# Patient Record
Sex: Female | Born: 1945 | Race: White | Hispanic: No | Marital: Married | Smoking: Never smoker
Health system: Southern US, Community
[De-identification: ages and names within clinical notes are randomized; demographics above are authoritative.]

## PROBLEM LIST (undated history)

## (undated) DIAGNOSIS — K589 Irritable bowel syndrome without diarrhea: Secondary | ICD-10-CM

## (undated) DIAGNOSIS — I10 Essential (primary) hypertension: Secondary | ICD-10-CM

## (undated) DIAGNOSIS — F419 Anxiety disorder, unspecified: Secondary | ICD-10-CM

## (undated) DIAGNOSIS — E785 Hyperlipidemia, unspecified: Secondary | ICD-10-CM

## (undated) DIAGNOSIS — G510 Bell's palsy: Secondary | ICD-10-CM

## (undated) DIAGNOSIS — D496 Neoplasm of unspecified behavior of brain: Secondary | ICD-10-CM

## (undated) DIAGNOSIS — K219 Gastro-esophageal reflux disease without esophagitis: Secondary | ICD-10-CM

## (undated) DIAGNOSIS — L409 Psoriasis, unspecified: Secondary | ICD-10-CM

## (undated) DIAGNOSIS — G459 Transient cerebral ischemic attack, unspecified: Secondary | ICD-10-CM

## (undated) DIAGNOSIS — M069 Rheumatoid arthritis, unspecified: Secondary | ICD-10-CM

## (undated) HISTORY — PX: COLONOSCOPY: SHX174

## (undated) HISTORY — PX: TONSILLECTOMY: SUR1361

## (undated) HISTORY — DX: Neoplasm of unspecified behavior of brain: D49.6

## (undated) HISTORY — PX: ESOPHAGOGASTRODUODENOSCOPY: SHX1529

## (undated) HISTORY — PX: ABDOMINAL HYSTERECTOMY: SHX81

---
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
## 1981-02-19 HISTORY — PX: BUNIONECTOMY: SHX129

## 1985-02-19 HISTORY — PX: LIPOMA EXCISION: SHX5283

## 1995-02-20 HISTORY — PX: CARDIAC CATHETERIZATION: SHX172

## 1997-12-24 ENCOUNTER — Encounter (HOSPITAL_COMMUNITY): Admission: RE | Admit: 1997-12-24 | Discharge: 1998-04-11 | Payer: Self-pay

## 1998-04-15 ENCOUNTER — Encounter (HOSPITAL_COMMUNITY): Admission: RE | Admit: 1998-04-15 | Discharge: 1998-05-10 | Payer: Self-pay

## 1999-01-19 ENCOUNTER — Encounter (HOSPITAL_COMMUNITY): Admission: RE | Admit: 1999-01-19 | Discharge: 1999-04-19 | Payer: Self-pay | Admitting: *Deleted

## 1999-11-23 ENCOUNTER — Ambulatory Visit (HOSPITAL_COMMUNITY): Admission: RE | Admit: 1999-11-23 | Discharge: 1999-11-23 | Payer: Self-pay | Admitting: Gastroenterology

## 1999-11-23 ENCOUNTER — Encounter: Payer: Self-pay | Admitting: Gastroenterology

## 2003-03-05 ENCOUNTER — Ambulatory Visit (HOSPITAL_COMMUNITY): Admission: RE | Admit: 2003-03-05 | Discharge: 2003-03-05 | Payer: Self-pay | Admitting: Neurology

## 2003-05-25 ENCOUNTER — Ambulatory Visit (HOSPITAL_COMMUNITY): Admission: RE | Admit: 2003-05-25 | Discharge: 2003-05-25 | Payer: Self-pay | Admitting: *Deleted

## 2003-11-23 ENCOUNTER — Encounter (HOSPITAL_COMMUNITY): Admission: RE | Admit: 2003-11-23 | Discharge: 2003-12-23 | Payer: Self-pay | Admitting: Internal Medicine

## 2004-02-20 DIAGNOSIS — G459 Transient cerebral ischemic attack, unspecified: Secondary | ICD-10-CM

## 2004-02-20 HISTORY — DX: Transient cerebral ischemic attack, unspecified: G45.9

## 2009-02-19 HISTORY — PX: FRACTURE SURGERY: SHX138

## 2009-03-19 ENCOUNTER — Inpatient Hospital Stay (HOSPITAL_COMMUNITY): Admission: EM | Admit: 2009-03-19 | Discharge: 2009-03-27 | Payer: Self-pay | Admitting: Emergency Medicine

## 2010-05-07 LAB — GLUCOSE, CAPILLARY
Glucose-Capillary: 112 mg/dL — ABNORMAL HIGH (ref 70–99)
Glucose-Capillary: 118 mg/dL — ABNORMAL HIGH (ref 70–99)
Glucose-Capillary: 120 mg/dL — ABNORMAL HIGH (ref 70–99)
Glucose-Capillary: 124 mg/dL — ABNORMAL HIGH (ref 70–99)

## 2010-05-07 LAB — CBC
MCHC: 32.4 g/dL (ref 30.0–36.0)
MCV: 82.1 fL (ref 78.0–100.0)
MCV: 82.6 fL (ref 78.0–100.0)
MCV: 82.7 fL (ref 78.0–100.0)
Platelets: 200 10*3/uL (ref 150–400)
Platelets: 211 10*3/uL (ref 150–400)
RDW: 17.6 % — ABNORMAL HIGH (ref 11.5–15.5)
WBC: 13.4 10*3/uL — ABNORMAL HIGH (ref 4.0–10.5)
WBC: 14 10*3/uL — ABNORMAL HIGH (ref 4.0–10.5)
WBC: 16.2 10*3/uL — ABNORMAL HIGH (ref 4.0–10.5)

## 2010-05-07 LAB — BASIC METABOLIC PANEL
BUN: 6 mg/dL (ref 6–23)
BUN: 9 mg/dL (ref 6–23)
Chloride: 104 mEq/L (ref 96–112)
Creatinine, Ser: 0.94 mg/dL (ref 0.4–1.2)
GFR calc Af Amer: >60 mL/min (ref >60)
GFR calc Af Amer: >60 mL/min (ref >60)
GFR calc non Af Amer: 53 mL/min — ABNORMAL LOW (ref >60)
GFR calc non Af Amer: 60 mL/min — ABNORMAL LOW (ref >60)
Glucose, Bld: 128 mg/dL — ABNORMAL HIGH (ref 70–99)
Glucose, Bld: 139 mg/dL — ABNORMAL HIGH (ref 70–99)
Potassium: 3.5 mEq/L (ref 3.5–5.1)
Potassium: 3.7 mEq/L (ref 3.5–5.1)
Sodium: 139 mEq/L (ref 135–145)
Sodium: 140 mEq/L (ref 135–145)

## 2010-05-07 LAB — COMPREHENSIVE METABOLIC PANEL
ALT: 20 U/L (ref 0–35)
Albumin: 3.1 g/dL — ABNORMAL LOW (ref 3.5–5.2)
Alkaline Phosphatase: 127 U/L — ABNORMAL HIGH (ref 39–117)
BUN: 11 mg/dL (ref 6–23)
CO2: 29 mEq/L (ref 19–32)
Calcium: 8.2 mg/dL — ABNORMAL LOW (ref 8.4–10.5)
Creatinine, Ser: 1.01 mg/dL (ref 0.4–1.2)
GFR calc non Af Amer: 55 mL/min — ABNORMAL LOW (ref >60)
Potassium: 3.4 mEq/L — ABNORMAL LOW (ref 3.5–5.1)
Sodium: 136 mEq/L (ref 135–145)

## 2010-05-07 LAB — DIFFERENTIAL
Basophils Absolute: 0.1 10*3/uL (ref 0.0–0.1)
Eosinophils Relative: 1 % (ref 0–5)
Lymphocytes Relative: 10 % — ABNORMAL LOW (ref 12–46)
Lymphs Abs: 1.6 10*3/uL (ref 0.7–4.0)
Monocytes Absolute: 0.6 10*3/uL (ref 0.1–1.0)
Monocytes Relative: 4 % (ref 3–12)

## 2010-05-07 LAB — PROTIME-INR: Prothrombin Time: 15.1 seconds (ref 11.6–15.2)

## 2010-05-07 LAB — URINALYSIS, ROUTINE W REFLEX MICROSCOPIC
Bilirubin Urine: NEGATIVE
Glucose, UA: NEGATIVE mg/dL
Hgb urine dipstick: NEGATIVE
Ketones, ur: NEGATIVE mg/dL
Nitrite: NEGATIVE
Specific Gravity, Urine: 1.012 (ref 1.005–1.030)
pH: 5.5 (ref 5.0–8.0)

## 2010-05-07 LAB — SAMPLE TO BLOOD BANK

## 2010-05-07 LAB — APTT: aPTT: 32 seconds (ref 24–37)

## 2010-05-10 LAB — GLUCOSE, CAPILLARY
Glucose-Capillary: 104 mg/dL — ABNORMAL HIGH (ref 70–99)
Glucose-Capillary: 106 mg/dL — ABNORMAL HIGH (ref 70–99)
Glucose-Capillary: 109 mg/dL — ABNORMAL HIGH (ref 70–99)
Glucose-Capillary: 111 mg/dL — ABNORMAL HIGH (ref 70–99)
Glucose-Capillary: 111 mg/dL — ABNORMAL HIGH (ref 70–99)
Glucose-Capillary: 112 mg/dL — ABNORMAL HIGH (ref 70–99)
Glucose-Capillary: 113 mg/dL — ABNORMAL HIGH (ref 70–99)
Glucose-Capillary: 118 mg/dL — ABNORMAL HIGH (ref 70–99)
Glucose-Capillary: 122 mg/dL — ABNORMAL HIGH (ref 70–99)
Glucose-Capillary: 124 mg/dL — ABNORMAL HIGH (ref 70–99)
Glucose-Capillary: 135 mg/dL — ABNORMAL HIGH (ref 70–99)
Glucose-Capillary: 97 mg/dL (ref 70–99)

## 2010-05-10 LAB — BASIC METABOLIC PANEL
CO2: 31 mEq/L (ref 19–32)
Calcium: 8.2 mg/dL — ABNORMAL LOW (ref 8.4–10.5)
Creatinine, Ser: 0.97 mg/dL (ref 0.4–1.2)
GFR calc Af Amer: >60 mL/min (ref >60)

## 2010-05-10 LAB — CROSSMATCH: Antibody Screen: NEGATIVE

## 2010-05-10 LAB — CBC
MCHC: 32.5 g/dL (ref 30.0–36.0)
Platelets: 212 10*3/uL (ref 150–400)
RBC: 3.19 MIL/uL — ABNORMAL LOW (ref 3.87–5.11)
WBC: 13.6 10*3/uL — ABNORMAL HIGH (ref 4.0–10.5)

## 2010-07-07 NOTE — Op Note (Signed)
NAMEJAWANA, [name]                             ACCOUNT NO.:  [id number]   MEDICAL RECORD NO.:  [id number]                   PATIENT TYPE:  OIB   LOCATION:  2899                                 FACILITY:  [facility]   PHYSICIAN:  Scott R. Rehm, D.D.S.               DATE OF BIRTH:  Mar 26, 1945   DATE OF PROCEDURE:  05/25/2003  DATE OF DISCHARGE:  05/25/2003                                 OPERATIVE REPORT   PREOPERATIVE DIAGNOSIS:  Dental caries.   PROCEDURE PERFORMED:  Surgical extraction of all teeth numbers 4, 5, 8, 9,  10, 11, 12, 20, 21, 22, 23, 24, 25, 26, 27, and 28; alveoloplasty times four  quadrants.   ANESTHESIA:  General anesthesia with orotracheal intubation.   SURGEON:  Scott R. Egbert Garibaldi, D.D.S.   BRIEF OPERATIVE INDICATIONS:  This is a 65 year old female referred by Dr.  Teodoro Kil for full mouth extraction with alveoloplasty. The patient  currently reports no pain.   PAST MEDICAL HISTORY:  Allergies are PENICILLIN and ASPIRIN. Medications are  Amaryl, Evista, and a fluid pill, she said. Surgical history is hysterectomy  and right foot and leg surgery.  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  She has gross caries with her  remaining teeth and these are the indications for her surgery.   BRIEF DESCRIPTION OF OPERATIVE TECHNIQUE:  [name] is brought to the main OR at  [facility], placed in the supine position. Appropriate monitors were  attached, general anesthesia was induced. She was given IV and inhalational  techniques, and an orotracheal intubation was then done by Dr. Sondra Come without  complications. The tube was secured to the patient's left side. The oral  cavity was suctioned and a throat pack was placed. The patient was then  prepped and draped in standard fashion for intraoral myofascial surgical  procedure. At this point 2% Xylocaine, 1:100,000 epinephrine a  total of 8 cc  was given bilaterally to the inferior volar long buccal, posterior-superior  alveolar, greater palatine, and buccal infiltration of the mandible was also  done in the maxilla. At this point, a #15 blade was used to make an incision  in tooth numbers 4 and 10, and periosteal elevator was used to elevate the  buccal tissue using a  #7 round burr. Minor osseous reduction was then in  the buccal, followed by use of a straight elevator, upbiters and forceps,  tooth numbers 4, 5, 8, 9, and 10 were removed without complications.  Alveoloplasty followed, followed by normal saline irrigation. Then 3-0  chromic gut sutures  times four finger-of-eight were placed. At this point  the patient's low right quadrant was addressed and a #15 blade was used to  make an incision distally to tooth #28; an incision going up to tooth number  23  area, using a #8 round burr, once again moderate osseous reduction was  done to the buccal, followed by a straight elevator. At which time, teeth  numbers 23, 24, 25, 26, 27, 28 were subluxed and removed using Ash forceps  in appropriate fashion. Minor alveoloplasty was then done on both sides of  the buccal lingular area and copious normal saline irrigation. Then 3-0  chromic gut sutures times six in a direct suturing fashion was done in this  region. At this point, the throat pack was removed and the tube was  transferred to the patient's right side. The oral cavity was then suctioned  and removed. Throat pack was placed. At this point a #15 blade was used to  make an incision from tooth number 12 to tooth number 11 area. Once again,  full thickness mucoperiosteal flaps were elevated using a periosteal  elevator and a #8 round burr and copious normal saline irrigation was used  to cut a buccal trial from around teeth 11 and 12.  Using a sterile  elevator, these teeth were subluxed and removed with the upbiter forceps.  Once again, minor alveoloplasty  followed in this quadrant, followed by  irrigation. Then 3-0 chromic gut sutures times four were placed once again  in finger-of-eight fashion.  At this point in the lower left quadrant, a #15  blade was used to make an incision distal to tooth #20 up through #22. Once  again, a full thickness mucoperiosteal flap was elevated using a periosteal  elevator and a #8 round burr. Copious normal saline irrigation was used to  cut a buccal trial from teeth numbers 20, 21, and 22. Once again using a  straight elevator, these teeth were subluxed and removed using the Ash  forceps. Minor alveoloplasty was done in this quadrant, followed by  irrigation, and then 3-0 chromic gut suture times three. At this point, the  oral cavity was suctioned. Needle, sponge, and instrument counts were found  to be correct. Estimated blood loss during the procedure was 15 cc. Fluid  replacement 500 cc. Urine output due to void.  The patient was extubated and  transferred to the recovery room. Vital signs were stable. The patient is  also given clindamycin 600 mg IV piggyback prior to the procedure.                                               Scott R. Egbert Garibaldi, D.D.S.    SRR/MEDQ  D:  05/25/2003  T:  05/25/2003  Job:  [id number]

## 2010-12-13 ENCOUNTER — Other Ambulatory Visit: Payer: Self-pay

## 2010-12-13 ENCOUNTER — Encounter (HOSPITAL_COMMUNITY): Payer: Self-pay

## 2010-12-13 ENCOUNTER — Encounter (HOSPITAL_COMMUNITY)
Admission: RE | Admit: 2010-12-13 | Discharge: 2010-12-13 | Disposition: A | Discharge status: Home / Self Care | Payer: Medicare Other | Source: Ambulatory Visit | Attending: Ophthalmology | Admitting: Ophthalmology

## 2010-12-13 HISTORY — DX: Essential (primary) hypertension: I10

## 2010-12-13 HISTORY — DX: Anxiety disorder, unspecified: F41.9

## 2010-12-13 HISTORY — DX: Bell's palsy: G51.0

## 2010-12-13 HISTORY — DX: Transient cerebral ischemic attack, unspecified: G45.9

## 2010-12-13 HISTORY — DX: Psoriasis, unspecified: L40.9

## 2010-12-13 HISTORY — DX: Irritable bowel syndrome without diarrhea: K58.9

## 2010-12-13 HISTORY — DX: Hyperlipidemia, unspecified: E78.5

## 2010-12-13 HISTORY — DX: Gastro-esophageal reflux disease without esophagitis: K21.9

## 2010-12-13 HISTORY — DX: Rheumatoid arthritis, unspecified: M06.9

## 2010-12-13 LAB — CBC
MCHC: 30.2 g/dL (ref 30.0–36.0)
Platelets: 250 10*3/uL (ref 150–400)
RDW: 16.9 % — ABNORMAL HIGH (ref 11.5–15.5)
WBC: 13.3 10*3/uL — ABNORMAL HIGH (ref 4.0–10.5)

## 2010-12-13 LAB — BASIC METABOLIC PANEL
BUN: 11 mg/dL (ref 6–23)
Chloride: 99 mEq/L (ref 96–112)
GFR calc Af Amer: 63 mL/min — ABNORMAL LOW (ref >90)
GFR calc non Af Amer: 54 mL/min — ABNORMAL LOW (ref >90)
Potassium: 3.5 mEq/L (ref 3.5–5.1)
Sodium: 139 mEq/L (ref 135–145)

## 2010-12-13 NOTE — Patient Instructions (Addendum)
[name]  12/13/2010   Your procedure is scheduled  on:  12/21/2010  Report to [facility] at  1000  AM.  Call this number if you have problems the morning of surgery: [phone]   Remember:   Do not eat food:After Midnight.  Do not drink clear liquids: After Midnight.  Take these medicines the morning of surgery with A SIP OF WATER: nexium,evista   Do not wear jewelry, make-up or nail polish.  Do not wear lotions, powders, or perfumes. You may wear deodorant.  Do not shave 48 hours prior to surgery.  Do not bring valuables to the hospital.  Contacts, dentures or bridgework may not be worn into surgery.  Leave suitcase in the car. After surgery it may be brought to your room.  For patients admitted to the hospital, checkout time is 11:00 AM the day of discharge.   Patients discharged the day of surgery will not be allowed to drive home.  Name and phone number of your driver: husband  Special Instructions: N/A   Please read over the following fact sheets that you were given: Pain Booklet, Surgical Site Infection Prevention, Anesthesia Post-op Instructions and Care and Recovery After Surgery PATIENT INSTRUCTIONS POST-ANESTHESIA  IMMEDIATELY FOLLOWING SURGERY:  Do not drive or operate machinery for the first twenty four hours after surgery.  Do not make any important decisions for twenty four hours after surgery or while taking narcotic pain medications or sedatives.  If you develop intractable nausea and vomiting or a severe headache please notify your doctor immediately.  FOLLOW-UP:  Please make an appointment with your surgeon as instructed. You do not need to follow up with anesthesia unless specifically instructed to do so.  WOUND CARE INSTRUCTIONS (if applicable):  Keep a dry clean dressing on the anesthesia/puncture wound site if there is drainage.  Once the wound has quit draining you may leave it open to air.  Generally you should leave the bandage intact for twenty four hours  unless there is drainage.  If the epidural site drains for more than 36-48 hours please call the anesthesia department.  QUESTIONS?:  Please feel free to call your physician or the hospital operator if you have any questions, and they will be happy to assist you.     [facility] Anesthesia Department [address] [place] [phone]    Cataract Surgery Care After Please read the instructions outlined below and refer to this sheet in the next few weeks. These discharge instructions provide you with general information on caring for yourself after you leave the hospital. Your caregiver may also give you specific instructions.  HOME CARE INSTRUCTIONS   After surgery, your caregiver will schedule exams to check on your progress. For a few days after surgery, you may be prescribed eyedrops or other medications to help healing and control the pressure inside your eye. Ask your caregiver how to use your medications, when to take them, and what effects they can have.   It's normal to feel itching and mild discomfort for a few days after cataract surgery. Some fluid discharge is also common, and your eye may be sensitive to light and touch. If you have discomfort, your caregiver may suggest a pain reliever every 4 to 6 hours. After 1 to 2 days, even moderate discomfort should disappear. In most cases, healing will take about 6 weeks.   Try not to touch or rub your eyes You may be instructed to wear an eye shield  or eye patch after surgery. If not, wear sunglasses to protect your eyes.   Keep soap and shampoo out of your eyes.   Only take over-the-counter or prescription medicines for pain, discomfort, or fever as directed by your caregiver.   If you suffer more than mild pain, or you experience loss of vision or increasing redness of your eye, you should contact your caregiver for advice.   When you are home, try not to bend or lift heavy objects. Bending increases pressure in the  eye. You can walk, climb stairs, and do light household chores. You can quickly return to many everyday activities, but your vision may be blurry. Ask your caregiver when you can resume driving.   If you just received an IOL (intraocular lens), you may notice that colors are very bright or have a blue tinge. Also, if you've been in bright sunlight, everything may appear reddish for a few hours. If you see these color tinges, it is because your lens is clear and no longer cloudy. Within a few months after receiving an IOL, these "extra" colors should go away. When you have healed, you will probably need new glasses.  SEEK MEDICAL CARE IF:   There is increased bruising around your eye.   You have a worsening or sudden loss of vision.   You notice redness, swelling, or increasing pain in the eye.   You have a fever.  Although we do not know how to protect against cataracts, people over the age of 46 are at risk for many vision problems. If you are age 59 or older, you should have an eye examination which includes dilating the pupils at least every 2 years. This kind of exam allows your eye care professional to check for signs of age-related macular degeneration, glaucoma, cataracts, and other vision disorders. Document Released: 08/25/2004 Document Revised: 10/18/2010 Document Reviewed: 02/04/2008 [facility] Patient Information 2012 [place], [place].Cataract A cataract is a clouding of the lens of the eye. It is most often related to aging. A cataract is not a "film" over the surface of the eye. The lens is inside the eye and changes size of the pupil. The lens can enlarge to let more light enter the eye in dark environments and contract the size of the pupil to let in bright light. The lens is the part of the eye that helps focus light on the retina. The retina is the eye's light-sensitive layer. It is in the back of the eye that sends visual signals to the brain. In a normal eye, light passes through the  lens and gets focused on the retina. To help produce a sharp image, the lens must remain clear. When a lens becomes cloudy, vision is compromised by the degree and nature of the clouding. Certain cataracts make people more near-sighted as they develop, others increase glare, and all reduce vision to some degree or another. A cataract that is so dense that it becomes milky white and a white opacity can be seen through the pupil. When the white color is seen, it is called a "mature" or "hyper-mature cataract." Such cataracts cause total blindness in the affected eye. The cataract must be removed to prevent damage to the eye itself. Some types of cataracts can cause a secondary disease of the eye, such as certain types of glaucoma. In the early stages, better lighting and eyeglasses may lessen vision problems caused by cataracts. At a certain point, surgery may be needed to improve vision. CAUSES  Aging. However, cataracts may occur at any age, even in newborns.   Certain drugs.   Trauma to the eye.   Certain diseases (such as diabetes).   Inherited or acquired medical syndromes.  SYMPTOMS   Gradual, progressive drop in vision in the affected eye. Cataracts may develop at different rates in each eye. Cataracts may even be in just one eye with the other unaffected.   Cataracts due to trauma may develop quickly, sometimes over a matter or days or even hours. The result is severe and rapid visual loss.  DIAGNOSIS  To detect a cataract, an eye doctor examines the lens. A well developed cataract can be diagnosed without dilating the pupil. Early cataracts and others of a specific nature are best diagnosed with an exam of the eyes with the pupils dilated by drops. TREATMENT   For an early cataract, vision may improve by using different eyeglasses or stronger lighting.   If the above measures do not help, surgery is the only effective treatment. This treatment removes the cloudy lens and replaces it  with a substitute lens (Intraocular lens, or IOL). Newly developed IOL technology allows the implanted lens to improve vision both at a distance and up close. Discuss with your eye surgeon about the possibility of still needing glasses. Also discuss how visual coordination between both eyes will be affected.  A cataract needs to be removed only when vision loss interferes with your everyday activities such as driving, reading or watching TV. You and your eye doctor can make that decision together. In most cases, waiting until you are ready to have cataract surgery will not harm your eye. If you have cataracts in both eyes, only one should be removed at a time. This allows the operated eye to heal and be out of danger from serious problems (such as infection or poor wound healing) before having the other eye undergo surgery.  Sometimes, a cataract should be removed even if it does not cause problems with your vision. For example, a cataract should be removed if it prevents examination or treatment of another eye problem. Just as you cannot see out of the affected eye well, your doctor cannot see into your eye well through a cataract. The vast majority of people who have cataract surgery have better vision afterward. CATARACT REMOVAL There are two primary ways to remove a cataract. Your doctor can explain the differences and help determine which is best for you:  Phacoemulsification (small incision cataract surgery). This involves making a small cut (incision) on the edge of the clear, dome-shaped surface that covers the front of the eye (the cornea). An injection behind the eye or eye drops are given to make this a painless procedure. The doctor then inserts a tiny probe into the eye. This device emits ultrasound waves that soften and break up the cloudy center of the lens so it can be removed by suction. Most cataract surgery is done this way. The cuts are usually so small and performed in such a manner that  often no sutures are needed to keep it closed.   Extracapsular surgery. Your doctor makes a slightly longer incision on the side of the cornea. The doctor removes the hard center of the lens. The remainder of the lens is then removed by suction. In some cases, extremely fine sutures are needed which the doctor may, or may not remove in the office after the surgery.  When an IOL is implanted, it needs no care. It becomes  a permanent part of your eye and cannot be seen or felt.  Some people cannot have an IOL. They may have problems during surgery, or maybe they have another eye disease. For these people, a soft contact lens may be suggested. If an IOL or contact lens cannot be used, very powerful and thick glasses are required after surgery. Since vision is very different through such thick glasses, it is important to have your doctor discuss the impact on your vision after any cataract surgery where there is no plan to implant an IOL. The normal lens of the eye is covered by a clear capsule. Both phacoemulsification and extracapsular surgery require that the back surface of this lens capsule be left in place. This helps support IOLs and prevents the IOL from dislocating and falling back into the deeper interior of the eye. Right after surgery, and often permanently this "posterior capsule" remains clear. In some cases however, it can become cloudy, presenting the same type of visual compromise that the original cataract did since light is again obstructed as it passes through the clear IOL. This condition is often referred to as an "after-cataract." Fortunately, after-cataracts are easily treated using a painless and very fast laser treatment that is performed without anesthesia or incisions. It is done in a matter of minutes in an outpatient environment. Visual improvement is often immediate.  HOME CARE INSTRUCTIONS   Your surgeon will discuss pre and post operative care with you prior to surgery. The  majority of people are able to do almost all normal activities right away. Although, it is often advised to avoid strenuous activity for a period of time.   Postoperative drops and careful avoidance of infection will be needed. Many surgeons suggest the use of a protective shield during the first few days after surgery.   There is a very small incidence of complication from modern cataract surgery, but it can happen. Infection that spreads to the inside of the eye (endophthalmitis) can result in total visual loss and even loss of the eye itself. In extremely rare instances, the inflammation of endophthalmitis can spread to both eyes (sympathetic ophthalmia). Appropriate post-operative care under the close observation of your surgeon is essential to a successful outcome.  SEEK IMMEDIATE MEDICAL CARE IF:   You have any sudden drop of vision in the operated eye.   You have pain in the operated eye.   You see a large number of floating dots in the field of vision in the operated eye.   You see flashing lights, or if a portion of your side vision in any direction appears black (like a curtain being drawn into your field of vision) in the operated eye.  Document Released: 02/05/2005 Document Revised: 10/18/2010 Document Reviewed: 03/24/2007 [facility] Patient Information 2012 [place], [place].

## 2010-12-21 ENCOUNTER — Encounter (HOSPITAL_COMMUNITY): Payer: Self-pay | Admitting: Ophthalmology

## 2010-12-21 ENCOUNTER — Encounter (HOSPITAL_COMMUNITY): Payer: Self-pay | Admitting: Anesthesiology

## 2010-12-21 ENCOUNTER — Ambulatory Visit (HOSPITAL_COMMUNITY)
Admission: RE | Admit: 2010-12-21 | Discharge: 2010-12-21 | Disposition: A | Discharge status: Home / Self Care | Payer: Medicare Other | Source: Ambulatory Visit | Attending: Ophthalmology | Admitting: Ophthalmology

## 2010-12-21 ENCOUNTER — Encounter (HOSPITAL_COMMUNITY)
Admission: RE | Disposition: A | Discharge status: Home / Self Care | Payer: Self-pay | Source: Ambulatory Visit | Attending: Ophthalmology

## 2010-12-21 DIAGNOSIS — Z79899 Other long term (current) drug therapy: Secondary | ICD-10-CM | POA: Insufficient documentation

## 2010-12-21 DIAGNOSIS — Z01810 Encounter for preprocedural cardiovascular examination: Secondary | ICD-10-CM | POA: Insufficient documentation

## 2010-12-21 DIAGNOSIS — Z01812 Encounter for preprocedural laboratory examination: Secondary | ICD-10-CM | POA: Insufficient documentation

## 2010-12-21 DIAGNOSIS — I10 Essential (primary) hypertension: Secondary | ICD-10-CM | POA: Insufficient documentation

## 2010-12-21 DIAGNOSIS — H2589 Other age-related cataract: Secondary | ICD-10-CM | POA: Insufficient documentation

## 2010-12-21 DIAGNOSIS — E119 Type 2 diabetes mellitus without complications: Secondary | ICD-10-CM | POA: Insufficient documentation

## 2010-12-21 HISTORY — PX: CATARACT EXTRACTION W/PHACO: SHX586

## 2010-12-21 SURGERY — PHACOEMULSIFICATION, CATARACT, WITH IOL INSERTION
Anesthesia: Monitor Anesthesia Care | Site: Eye | Laterality: Left | Wound class: Clean

## 2010-12-21 MED ORDER — PHENYLEPHRINE HCL 2.5 % OP SOLN
1.0000 [drp] | OPHTHALMIC | Status: AC
  Administered 2010-12-21 (×3): 1 [drp] via OPHTHALMIC

## 2010-12-21 MED ORDER — PROVISC 10 MG/ML IO SOLN
INTRAOCULAR | Status: DC | PRN
  Administered 2010-12-21: 8.5 mg via INTRAOCULAR

## 2010-12-21 MED ORDER — PHENYLEPHRINE HCL 2.5 % OP SOLN
OPHTHALMIC | Status: AC
  Administered 2010-12-21: 1 [drp] via OPHTHALMIC
  Filled 2010-12-21: qty 2

## 2010-12-21 MED ORDER — TETRACAINE HCL 0.5 % OP SOLN
OPHTHALMIC | Status: AC
  Administered 2010-12-21: 1 [drp] via OPHTHALMIC
  Filled 2010-12-21: qty 2

## 2010-12-21 MED ORDER — POVIDONE-IODINE 5 % OP SOLN
OPHTHALMIC | Status: DC | PRN
  Administered 2010-12-21: 1 via OPHTHALMIC

## 2010-12-21 MED ORDER — LIDOCAINE HCL (PF) 1 % IJ SOLN
INTRAOCULAR | Status: DC | PRN
  Administered 2010-12-21: 12:00:00 via OPHTHALMIC

## 2010-12-21 MED ORDER — NEOMYCIN-POLYMYXIN-DEXAMETH 3.5-10000-0.1 OP OINT
TOPICAL_OINTMENT | OPHTHALMIC | Status: AC
  Filled 2010-12-21: qty 3.5

## 2010-12-21 MED ORDER — EPINEPHRINE HCL 1 MG/ML IJ SOLN
INTRAOCULAR | Status: DC | PRN
  Administered 2010-12-21: 12:00:00

## 2010-12-21 MED ORDER — LIDOCAINE HCL 3.5 % OP GEL
OPHTHALMIC | Status: AC
  Filled 2010-12-21: qty 5

## 2010-12-21 MED ORDER — NEOMYCIN-POLYMYXIN-DEXAMETH 0.1 % OP OINT
TOPICAL_OINTMENT | OPHTHALMIC | Status: DC | PRN
  Administered 2010-12-21: 1 via OPHTHALMIC

## 2010-12-21 MED ORDER — CYCLOPENTOLATE-PHENYLEPHRINE 0.2-1 % OP SOLN
OPHTHALMIC | Status: AC
  Administered 2010-12-21: 1 [drp] via OPHTHALMIC
  Filled 2010-12-21: qty 2

## 2010-12-21 MED ORDER — FENTANYL CITRATE 0.05 MG/ML IJ SOLN
INTRAMUSCULAR | Status: AC
  Filled 2010-12-21: qty 2

## 2010-12-21 MED ORDER — LIDOCAINE HCL (PF) 1 % IJ SOLN
INTRAMUSCULAR | Status: AC
  Filled 2010-12-21: qty 2

## 2010-12-21 MED ORDER — CYCLOPENTOLATE-PHENYLEPHRINE 0.2-1 % OP SOLN
1.0000 [drp] | OPHTHALMIC | Status: AC
  Administered 2010-12-21 (×3): 1 [drp] via OPHTHALMIC

## 2010-12-21 MED ORDER — LIDOCAINE 3.5 % OP GEL OPTIME - NO CHARGE
OPHTHALMIC | Status: DC | PRN
  Administered 2010-12-21: 1 [drp] via OPHTHALMIC

## 2010-12-21 MED ORDER — EPINEPHRINE HCL 1 MG/ML IJ SOLN
INTRAMUSCULAR | Status: AC
  Filled 2010-12-21: qty 1

## 2010-12-21 MED ORDER — LACTATED RINGERS IV SOLN
INTRAVENOUS | Status: DC
  Administered 2010-12-21: 11:00:00 via INTRAVENOUS

## 2010-12-21 MED ORDER — MIDAZOLAM HCL 2 MG/2ML IJ SOLN
INTRAMUSCULAR | Status: AC
  Administered 2010-12-21: 2 mg via INTRAVENOUS
  Filled 2010-12-21: qty 2

## 2010-12-21 MED ORDER — EPHEDRINE SULFATE 50 MG/ML IJ SOLN
INTRAMUSCULAR | Status: DC | PRN
  Administered 2010-12-21 (×2): 25 mg via INTRAVENOUS

## 2010-12-21 MED ORDER — LIDOCAINE HCL 3.5 % OP GEL: 1.0000 "application " | Freq: Once | OPHTHALMIC | Status: DC

## 2010-12-21 MED ORDER — TETRACAINE HCL 0.5 % OP SOLN
1.0000 [drp] | OPHTHALMIC | Status: AC
  Administered 2010-12-21 (×3): 1 [drp] via OPHTHALMIC

## 2010-12-21 MED ORDER — MIDAZOLAM HCL 2 MG/2ML IJ SOLN
1.0000 mg | INTRAMUSCULAR | Status: DC | PRN
  Administered 2010-12-21: 2 mg via INTRAVENOUS

## 2010-12-21 MED ORDER — BSS IO SOLN
INTRAOCULAR | Status: DC | PRN
  Administered 2010-12-21: 15 mL via INTRAOCULAR

## 2010-12-21 SURGICAL SUPPLY — 34 items
CAPSULAR TENSION RING-AMO (OPHTHALMIC RELATED) IMPLANT
CLOTH BEACON ORANGE TIMEOUT ST (SAFETY) ×2 IMPLANT
DUOVISC SYSTEM (INTRAOCULAR LENS)
EYE SHIELD UNIVERSAL CLEAR (GAUZE/BANDAGES/DRESSINGS) ×2 IMPLANT
GLOVE BIO SURGEON STRL SZ 6.5 (GLOVE) IMPLANT
GLOVE BIOGEL PI IND STRL 6.5 (GLOVE) ×1 IMPLANT
GLOVE BIOGEL PI IND STRL 7.0 (GLOVE) IMPLANT
GLOVE BIOGEL PI IND STRL 7.5 (GLOVE) IMPLANT
GLOVE BIOGEL PI INDICATOR 6.5 (GLOVE) ×1
GLOVE BIOGEL PI INDICATOR 7.0 (GLOVE)
GLOVE BIOGEL PI INDICATOR 7.5 (GLOVE)
GLOVE ECLIPSE 6.5 STRL STRAW (GLOVE) IMPLANT
GLOVE ECLIPSE 7.0 STRL STRAW (GLOVE) IMPLANT
GLOVE ECLIPSE 7.5 STRL STRAW (GLOVE) IMPLANT
GLOVE EXAM NITRILE LRG STRL (GLOVE) ×2 IMPLANT
GLOVE EXAM NITRILE MD LF STRL (GLOVE) IMPLANT
GLOVE SKINSENSE NS SZ6.5 (GLOVE)
GLOVE SKINSENSE NS SZ7.0 (GLOVE)
GLOVE SKINSENSE STRL SZ6.5 (GLOVE) IMPLANT
GLOVE SKINSENSE STRL SZ7.0 (GLOVE) IMPLANT
KIT VITRECTOMY (OPHTHALMIC RELATED) IMPLANT
PAD ARMBOARD 7.5X6 YLW CONV (MISCELLANEOUS) ×2 IMPLANT
PROC W NO LENS (INTRAOCULAR LENS)
PROC W SPEC LENS (INTRAOCULAR LENS)
PROCESS W NO LENS (INTRAOCULAR LENS) IMPLANT
PROCESS W SPEC LENS (INTRAOCULAR LENS) IMPLANT
RING MALYGIN (MISCELLANEOUS) IMPLANT
SIGHTPATH CAT PROC W REG LENS (Ophthalmic Related) ×2 IMPLANT
SYR TB 1ML LL NO SAFETY (SYRINGE) ×2 IMPLANT
SYSTEM DUOVISC (INTRAOCULAR LENS) IMPLANT
TAPE SURG TRANSPORE 1 IN (GAUZE/BANDAGES/DRESSINGS) ×1 IMPLANT
TAPE SURGICAL TRANSPORE 1 IN (GAUZE/BANDAGES/DRESSINGS) ×1
VISCOELASTIC ADDITIONAL (OPHTHALMIC RELATED) IMPLANT
WATER STERILE IRR 250ML POUR (IV SOLUTION) ×2 IMPLANT

## 2010-12-21 NOTE — Brief Op Note (Signed)
Pre-Op Dx: Cataract OS Post-Op Dx: Cataract OS Surgeon: Kaycen Whitworth Anesthesia: Topical with MAC Implant: Lenstec, Model Softec HD Specimen: None Complications: None 

## 2010-12-21 NOTE — Anesthesia Postprocedure Evaluation (Signed)
  Anesthesia Post-op Note  Patient: [name]  Procedure(s) Performed:  CATARACT EXTRACTION PHACO AND INTRAOCULAR LENS PLACEMENT (IOC) - CDE: 10.44  Patient Location:  Short Stay  Anesthesia Type: MAC  Level of Consciousness: awake  Airway and Oxygen Therapy: Patient Spontanous Breathing  Post-op Pain: none  Post-op Assessment: Post-op Vital signs reviewed, Patient's Cardiovascular Status Stable, Respiratory Function Stable, Patent Airway, No signs of Nausea or vomiting and Pain level controlled  Post-op Vital Signs: Reviewed and stable  Complications: No apparent anesthesia complications

## 2010-12-21 NOTE — Anesthesia Preprocedure Evaluation (Addendum)
Anesthesia Evaluation  Patient identified by MRN, date of birth, ID band Patient awake  General Assessment Comment  Reviewed: Allergy & Precautions, H&P , NPO status , Patient's Chart, lab work & pertinent test results  Airway Mallampati: III      Dental  (+) Teeth Intact   Pulmonary asthma    Pulmonary exam normal       Cardiovascular hypertension, Pt. on medications Regular     Neuro/Psych PSYCHIATRIC DISORDERS Anxiety    GI/Hepatic GERD Medicated  Endo/Other  Diabetes mellitus-, Well Controlled, Type 2, Oral Hypoglycemic Agents  Renal/GU      Musculoskeletal   Abdominal   Peds  Hematology   Anesthesia Other Findings   Reproductive/Obstetrics                           Anesthesia Physical Anesthesia Plan  ASA: III  Anesthesia Plan: MAC   Post-op Pain Management:    Induction:   Airway Management Planned: Nasal Cannula  Additional Equipment:   Intra-op Plan:   Post-operative Plan:   Informed Consent: I have reviewed the patients History and Physical, chart, labs and discussed the procedure including the risks, benefits and alternatives for the proposed anesthesia with the patient or authorized representative who has indicated his/her understanding and acceptance.     Plan Discussed with:   Anesthesia Plan Comments:         Anesthesia Quick Evaluation

## 2010-12-21 NOTE — H&P (Signed)
I have reviewed the H&P, the patient was re-examined, and I have identified no interval changes in medical condition and plan of care since the history and physical of record  

## 2010-12-21 NOTE — Transfer of Care (Signed)
Immediate Anesthesia Transfer of Care Note  Patient: [name]  Procedure(s) Performed:  CATARACT EXTRACTION PHACO AND INTRAOCULAR LENS PLACEMENT (IOC) - CDE: 10.44  Patient Location: Shortstay  Anesthesia Type: MAC  Level of Consciousness: awake  Airway & Oxygen Therapy: Patient Spontanous Breathing   Post-op Assessment: Report given to PACU RN, Post -op Vital signs reviewed and stable and Patient moving all extremities  Post vital signs: Reviewed and stable  Complications: No apparent anesthesia complications

## 2010-12-22 NOTE — Op Note (Signed)
NAMEELBERTA, [name] NO.:  [id number]  MEDICAL RECORD NO.:  [id number]  LOCATION:  [facility]                          FACILITY:  [facility]  PHYSICIAN:  Susanne Greenhouse, MD       DATE OF BIRTH:  June 10, 1945  DATE OF PROCEDURE:  12/21/2010 DATE OF DISCHARGE:  12/21/2010                              OPERATIVE REPORT   PREOPERATIVE DIAGNOSIS:  Combined cataract, left eye.  POSTOPERATIVE DIAGNOSIS:  Combined cataract, left eye.  DIAGNOSIS CODE:  366.19.  OPERATION PERFORMED:  Phacoemulsification with posterior chamber intraocular lens implantation, left eye.  SURGEON:  Susanne Greenhouse, MD.  ANESTHESIA:  General endotracheal anesthesia.  OPERATIVE SUMMARY:  In the preoperative area, dilating drops were placed into the left eye.  The patient was then brought into the operating room where she was placed under general anesthesia.  The eye was then prepped and draped.  Beginning with a 75 blade, a paracentesis port was made at the surgeon's 2 o'clock position.  The anterior chamber was then filled with a 1% nonpreserved lidocaine solution with epinephrine.  This was followed by Viscoat to deepen the chamber.  A small fornix-based peritomy was performed superiorly.  Next, a single iris hook was placed through the limbus superiorly.  A 2.4-mm keratome blade was then used to make a clear corneal incision over the iris hook.  A bent cystotome needle and Utrata forceps were used to create a continuous tear capsulotomy.  Hydrodissection was performed using balanced salt solution on a fine cannula.  The lens nucleus was then removed using phacoemulsification in a quadrant cracking technique.  The cortical material was then removed with irrigation and aspiration.  The capsular bag and anterior chamber were refilled with Provisc.  The wound was widened to approximately 3 mm and a posterior chamber intraocular lens was placed into the capsular bag without difficulty using an [employer]  lens injecting system.  A single 10-0 nylon suture was then used to close the incision as well as stromal hydration.  The Provisc was removed from the anterior chamber and capsular bag with irrigation and aspiration.  At this point, the wounds were tested for leak, which were negative.  The anterior chamber remained deep and stable.  The patient tolerated the procedure well.  There were no operative complications, and she awoke from general anesthesia without problem.  No surgical specimens.  Prosthetic device used is a Lenstec posterior chamber lens model Softec HD, power of 26.5, serial number is [id number].          ______________________________ Susanne Greenhouse, MD     KEH/MEDQ  D:  12/21/2010  T:  12/22/2010  Job:  [id number]

## 2010-12-27 ENCOUNTER — Encounter (HOSPITAL_COMMUNITY): Payer: Self-pay | Admitting: Ophthalmology

## 2011-01-02 ENCOUNTER — Encounter (HOSPITAL_COMMUNITY): Payer: Self-pay | Admitting: Pharmacy Technician

## 2011-01-10 ENCOUNTER — Encounter (HOSPITAL_COMMUNITY)
Admission: RE | Admit: 2011-01-10 | Discharge: 2011-01-10 | Payer: Medicare Other | Source: Ambulatory Visit | Admitting: Ophthalmology

## 2011-01-10 ENCOUNTER — Encounter (HOSPITAL_COMMUNITY): Payer: Self-pay

## 2011-01-15 ENCOUNTER — Encounter (HOSPITAL_COMMUNITY): Payer: Self-pay

## 2011-01-15 ENCOUNTER — Encounter (HOSPITAL_COMMUNITY)
Admission: RE | Disposition: A | Discharge status: Home / Self Care | Payer: Self-pay | Source: Ambulatory Visit | Attending: Ophthalmology

## 2011-01-15 ENCOUNTER — Ambulatory Visit (HOSPITAL_COMMUNITY): Payer: Medicare Other | Admitting: Anesthesiology

## 2011-01-15 ENCOUNTER — Encounter (HOSPITAL_COMMUNITY): Payer: Self-pay | Admitting: Anesthesiology

## 2011-01-15 ENCOUNTER — Ambulatory Visit (HOSPITAL_COMMUNITY)
Admission: RE | Admit: 2011-01-15 | Discharge: 2011-01-15 | Disposition: A | Discharge status: Home / Self Care | Payer: Medicare Other | Source: Ambulatory Visit | Attending: Ophthalmology | Admitting: Ophthalmology

## 2011-01-15 DIAGNOSIS — Z79899 Other long term (current) drug therapy: Secondary | ICD-10-CM | POA: Insufficient documentation

## 2011-01-15 DIAGNOSIS — H2510 Age-related nuclear cataract, unspecified eye: Secondary | ICD-10-CM | POA: Insufficient documentation

## 2011-01-15 DIAGNOSIS — E119 Type 2 diabetes mellitus without complications: Secondary | ICD-10-CM | POA: Insufficient documentation

## 2011-01-15 DIAGNOSIS — I10 Essential (primary) hypertension: Secondary | ICD-10-CM | POA: Insufficient documentation

## 2011-01-15 DIAGNOSIS — Z01812 Encounter for preprocedural laboratory examination: Secondary | ICD-10-CM | POA: Insufficient documentation

## 2011-01-15 HISTORY — PX: CATARACT EXTRACTION W/PHACO: SHX586

## 2011-01-15 SURGERY — PHACOEMULSIFICATION, CATARACT, WITH IOL INSERTION
Anesthesia: Monitor Anesthesia Care | Site: Eye | Laterality: Right | Wound class: Clean

## 2011-01-15 MED ORDER — MIDAZOLAM HCL 2 MG/2ML IJ SOLN
INTRAMUSCULAR | Status: AC
  Administered 2011-01-15: 2 mg via INTRAVENOUS
  Filled 2011-01-15: qty 2

## 2011-01-15 MED ORDER — POVIDONE-IODINE 5 % OP SOLN
OPHTHALMIC | Status: DC | PRN
  Administered 2011-01-15: 1 via OPHTHALMIC

## 2011-01-15 MED ORDER — PHENYLEPHRINE HCL 2.5 % OP SOLN
1.0000 [drp] | OPHTHALMIC | Status: AC
  Administered 2011-01-15 (×3): 1 [drp] via OPHTHALMIC

## 2011-01-15 MED ORDER — PROVISC 10 MG/ML IO SOLN
INTRAOCULAR | Status: DC | PRN
  Administered 2011-01-15: 8.5 mg via INTRAOCULAR

## 2011-01-15 MED ORDER — TETRACAINE HCL 0.5 % OP SOLN
OPHTHALMIC | Status: AC
  Administered 2011-01-15: 1 [drp] via OPHTHALMIC
  Filled 2011-01-15: qty 2

## 2011-01-15 MED ORDER — CYCLOPENTOLATE-PHENYLEPHRINE 0.2-1 % OP SOLN
1.0000 [drp] | OPHTHALMIC | Status: AC
  Administered 2011-01-15 (×3): 1 [drp] via OPHTHALMIC

## 2011-01-15 MED ORDER — NEOMYCIN-POLYMYXIN-DEXAMETH 3.5-10000-0.1 OP OINT
TOPICAL_OINTMENT | OPHTHALMIC | Status: AC
  Filled 2011-01-15: qty 3.5

## 2011-01-15 MED ORDER — NEOMYCIN-POLYMYXIN-DEXAMETH 0.1 % OP OINT
TOPICAL_OINTMENT | OPHTHALMIC | Status: DC | PRN
  Administered 2011-01-15: 1 via OPHTHALMIC

## 2011-01-15 MED ORDER — LIDOCAINE HCL 3.5 % OP GEL
1.0000 "application " | Freq: Once | OPHTHALMIC | Status: AC
  Administered 2011-01-15: 1 via OPHTHALMIC

## 2011-01-15 MED ORDER — MIDAZOLAM HCL 2 MG/2ML IJ SOLN
1.0000 mg | INTRAMUSCULAR | Status: DC | PRN
  Administered 2011-01-15: 2 mg via INTRAVENOUS

## 2011-01-15 MED ORDER — LIDOCAINE HCL (PF) 1 % IJ SOLN
INTRAOCULAR | Status: DC | PRN
  Administered 2011-01-15: 11:00:00 via OPHTHALMIC

## 2011-01-15 MED ORDER — BSS IO SOLN
INTRAOCULAR | Status: DC | PRN
  Administered 2011-01-15: 15 mL via INTRAOCULAR

## 2011-01-15 MED ORDER — EPINEPHRINE HCL 1 MG/ML IJ SOLN
INTRAMUSCULAR | Status: AC
  Filled 2011-01-15: qty 1

## 2011-01-15 MED ORDER — LIDOCAINE HCL (PF) 1 % IJ SOLN
INTRAMUSCULAR | Status: AC
  Filled 2011-01-15: qty 2

## 2011-01-15 MED ORDER — MIDAZOLAM HCL 2 MG/2ML IJ SOLN
INTRAMUSCULAR | Status: AC
  Filled 2011-01-15: qty 2

## 2011-01-15 MED ORDER — CYCLOPENTOLATE-PHENYLEPHRINE 0.2-1 % OP SOLN
OPHTHALMIC | Status: AC
  Administered 2011-01-15: 1 [drp] via OPHTHALMIC
  Filled 2011-01-15: qty 2

## 2011-01-15 MED ORDER — LIDOCAINE 3.5 % OP GEL OPTIME - NO CHARGE
OPHTHALMIC | Status: DC | PRN
  Administered 2011-01-15: 1 [drp] via OPHTHALMIC

## 2011-01-15 MED ORDER — PHENYLEPHRINE HCL 2.5 % OP SOLN
OPHTHALMIC | Status: AC
  Administered 2011-01-15: 1 [drp] via OPHTHALMIC
  Filled 2011-01-15: qty 2

## 2011-01-15 MED ORDER — LIDOCAINE HCL 3.5 % OP GEL
OPHTHALMIC | Status: AC
  Administered 2011-01-15: 1 via OPHTHALMIC
  Filled 2011-01-15: qty 5

## 2011-01-15 MED ORDER — TETRACAINE HCL 0.5 % OP SOLN
1.0000 [drp] | OPHTHALMIC | Status: AC
  Administered 2011-01-15 (×3): 1 [drp] via OPHTHALMIC

## 2011-01-15 MED ORDER — EPINEPHRINE HCL 1 MG/ML IJ SOLN
INTRAOCULAR | Status: DC | PRN
  Administered 2011-01-15: 11:00:00

## 2011-01-15 MED ORDER — LACTATED RINGERS IV SOLN
INTRAVENOUS | Status: DC
  Administered 2011-01-15: 10:00:00 via INTRAVENOUS

## 2011-01-15 MED ORDER — MIDAZOLAM HCL 5 MG/5ML IJ SOLN
INTRAMUSCULAR | Status: DC | PRN
  Administered 2011-01-15 (×2): 1 mg via INTRAVENOUS

## 2011-01-15 SURGICAL SUPPLY — 31 items
CAPSULAR TENSION RING-AMO (OPHTHALMIC RELATED) IMPLANT
CLOTH BEACON ORANGE TIMEOUT ST (SAFETY) ×2 IMPLANT
EYE SHIELD UNIVERSAL CLEAR (GAUZE/BANDAGES/DRESSINGS) ×2 IMPLANT
GLOVE BIO SURGEON STRL SZ 6.5 (GLOVE) IMPLANT
GLOVE BIOGEL PI IND STRL 6.5 (GLOVE) IMPLANT
GLOVE BIOGEL PI IND STRL 7.0 (GLOVE) ×1 IMPLANT
GLOVE BIOGEL PI IND STRL 7.5 (GLOVE) IMPLANT
GLOVE BIOGEL PI INDICATOR 6.5 (GLOVE)
GLOVE BIOGEL PI INDICATOR 7.0 (GLOVE) ×1
GLOVE BIOGEL PI INDICATOR 7.5 (GLOVE)
GLOVE ECLIPSE 6.5 STRL STRAW (GLOVE) IMPLANT
GLOVE ECLIPSE 7.0 STRL STRAW (GLOVE) IMPLANT
GLOVE ECLIPSE 7.5 STRL STRAW (GLOVE) IMPLANT
GLOVE EXAM NITRILE LRG STRL (GLOVE) IMPLANT
GLOVE EXAM NITRILE MD LF STRL (GLOVE) ×2 IMPLANT
GLOVE SKINSENSE NS SZ6.5 (GLOVE)
GLOVE SKINSENSE NS SZ7.0 (GLOVE)
GLOVE SKINSENSE STRL SZ6.5 (GLOVE) IMPLANT
GLOVE SKINSENSE STRL SZ7.0 (GLOVE) IMPLANT
KIT VITRECTOMY (OPHTHALMIC RELATED) IMPLANT
PAD ARMBOARD 7.5X6 YLW CONV (MISCELLANEOUS) ×2 IMPLANT
PROC W NO LENS (INTRAOCULAR LENS)
PROC W SPEC LENS (INTRAOCULAR LENS)
PROCESS W NO LENS (INTRAOCULAR LENS) IMPLANT
PROCESS W SPEC LENS (INTRAOCULAR LENS) IMPLANT
RING MALYGIN (MISCELLANEOUS) IMPLANT
SIGHTPATH CAT PROC W REG LENS (Ophthalmic Related) ×2 IMPLANT
SYR TB 1ML LL NO SAFETY (SYRINGE) ×2 IMPLANT
TAPE TRANSPARENT 1/2IN (GAUZE/BANDAGES/DRESSINGS) ×2 IMPLANT
VISCOELASTIC ADDITIONAL (OPHTHALMIC RELATED) IMPLANT
WATER STERILE IRR 250ML POUR (IV SOLUTION) ×2 IMPLANT

## 2011-01-15 NOTE — Transfer of Care (Signed)
Immediate Anesthesia Transfer of Care Note  Patient: [name]  Procedure(s) Performed:  CATARACT EXTRACTION PHACO AND INTRAOCULAR LENS PLACEMENT (IOC) - CDE:9.37  Patient Location: Shortstay  Anesthesia Type: MAC  Level of Consciousness: awake  Airway & Oxygen Therapy: Patient Spontanous Breathing   Post-op Assessment: Report given to PACU RN, Post -op Vital signs reviewed and stable and Patient moving all extremities  Post vital signs: Reviewed and stable  Complications: No apparent anesthesia complications

## 2011-01-15 NOTE — H&P (Signed)
I have reviewed the H&P, the patient was re-examined, and I have identified no interval changes in medical condition and plan of care since the history and physical of record  

## 2011-01-15 NOTE — Brief Op Note (Signed)
Pre-Op Dx: Cataract OD Post-Op Dx: Cataract OD Surgeon: Wylene Weissman Anesthesia: Topical with MAC Implant: Lenstec, Model Softec HD Blood Loss: None Specimen: None Complications: None 

## 2011-01-15 NOTE — Anesthesia Procedure Notes (Signed)
Procedure Name: MAC Date/Time: 01/15/2011 10:45 AM Performed by: Minerva Areola Pre-anesthesia Checklist: Patient identified, Patient being monitored, Emergency Drugs available, Timeout performed and Suction available Patient Re-evaluated:Patient Re-evaluated prior to induction

## 2011-01-15 NOTE — Anesthesia Preprocedure Evaluation (Signed)
Anesthesia Evaluation  Patient identified by MRN, date of birth, ID band Patient awake    Reviewed: Allergy & Precautions, H&P , NPO status , Patient's Chart, lab work & pertinent test results  Airway Mallampati: III      Dental  (+) Teeth Intact   Pulmonary asthma ,    Pulmonary exam normal       Cardiovascular hypertension, Pt. on medications Regular     Neuro/Psych PSYCHIATRIC DISORDERS Anxiety    GI/Hepatic GERD-  Medicated,  Endo/Other  Diabetes mellitus-, Well Controlled, Type 2, Oral Hypoglycemic Agents  Renal/GU      Musculoskeletal   Abdominal   Peds  Hematology   Anesthesia Other Findings   Reproductive/Obstetrics                           Anesthesia Physical Anesthesia Plan  ASA: III  Anesthesia Plan: MAC   Post-op Pain Management:    Induction: Intravenous  Airway Management Planned: Simple Face Mask  Additional Equipment:   Intra-op Plan:   Post-operative Plan:   Informed Consent: I have reviewed the patients History and Physical, chart, labs and discussed the procedure including the risks, benefits and alternatives for the proposed anesthesia with the patient or authorized representative who has indicated his/her understanding and acceptance.     Plan Discussed with:   Anesthesia Plan Comments:         Anesthesia Quick Evaluation

## 2011-01-15 NOTE — Anesthesia Postprocedure Evaluation (Signed)
  Anesthesia Post-op Note  Patient: [name]  Procedure(s) Performed:  CATARACT EXTRACTION PHACO AND INTRAOCULAR LENS PLACEMENT (IOC) - CDE:9.37  Patient Location:  Short Stay  Anesthesia Type: MAC  Level of Consciousness: awake  Airway and Oxygen Therapy: Patient Spontanous Breathing  Post-op Pain: none  Post-op Assessment: Post-op Vital signs reviewed, Patient's Cardiovascular Status Stable, Respiratory Function Stable, Patent Airway, No signs of Nausea or vomiting and Pain level controlled  Post-op Vital Signs: Reviewed and stable  Complications: No apparent anesthesia complications

## 2011-01-19 ENCOUNTER — Encounter (HOSPITAL_COMMUNITY): Payer: Self-pay | Admitting: Ophthalmology

## 2011-02-02 NOTE — Op Note (Signed)
NAMETRINITEY, ROACHE NO.:  [id number]  MEDICAL RECORD NO.:  [id number]  LOCATION:  [facility]                          FACILITY:  [facility]  PHYSICIAN:  Susanne Greenhouse, MD       DATE OF BIRTH:  1946-02-19  DATE OF PROCEDURE:  01/16/2011 DATE OF DISCHARGE:  01/15/2011                              OPERATIVE REPORT   PREOPERATIVE DIAGNOSIS:  Combined cataract, right eye, diagnosis code 366.19.  POSTOPERATIVE DIAGNOSIS:  Combined cataract, right eye, diagnosis code 366.19.  OPERATION PERFORMED:  Phacoemulsification with posterior chamber intraocular lens implantation, right eye.  SURGEON:  Bonne Dolores. Mikeya Tomasetti, MD.  ANESTHESIA:  General endotracheal anesthesia.  OPERATIVE SUMMARY:  In the preoperative area, dilating drops were placed into the right eye.  The patient was then brought into the operating room where she was placed under general anesthesia.  The eye was then prepped and draped.  Beginning with a 75 blade, a paracentesis port was made at the surgeon's 2 o'clock position.  The anterior chamber was then filled with a 1% nonpreserved lidocaine solution with epinephrine.  This was followed by Viscoat to deepen the chamber.  A small fornix-based peritomy was performed superiorly.  Next, a single iris hook was placed through the limbus superiorly.  A 2.4-mm keratome blade was then used to make a clear corneal incision over the iris hook.  A bent cystotome needle and Utrata forceps were used to create a continuous tear capsulotomy.  Hydrodissection was performed using balanced salt solution on a fine cannula.  The lens nucleus was then removed using phacoemulsification in a quadrant cracking technique.  The cortical material was then removed with irrigation and aspiration.  The capsular bag and anterior chamber were refilled with Provisc.  The wound was widened to approximately 3 mm and a posterior chamber intraocular lens was placed into the capsular bag without difficulty  using an [employer] lens injecting system.  A single 10-0 nylon suture was then used to close the incision as well as stromal hydration.  The Provisc was removed from the anterior chamber and capsular bag with irrigation and aspiration.  At this point, the wounds were tested for leak, which were negative.  The anterior chamber remained deep and stable.  The patient tolerated the procedure well.  There were no operative complications, and she awoke from general anesthesia without problem.  No surgical specimens.  Prosthetic device used is a Lenstec posterior chamber lens, model Softec HD, power of 26.5, serial number is [id number].          ______________________________ Susanne Greenhouse, MD     KEH/MEDQ  D:  02/01/2011  T:  02/02/2011  Job:  [id number]

## 2011-04-30 IMAGING — CR DG FEMUR 2+V PORT*R*
1 series · 1 of 1 positions shown · non-contrast
Comparison: Portable exam 2225 hours compared to preoperative exam
of 03/19/2009

CLINICAL DATA: Right femur fracture status post nailing

PORTABLE RIGHT FEMUR - 2 VIEW

[view not recorded]
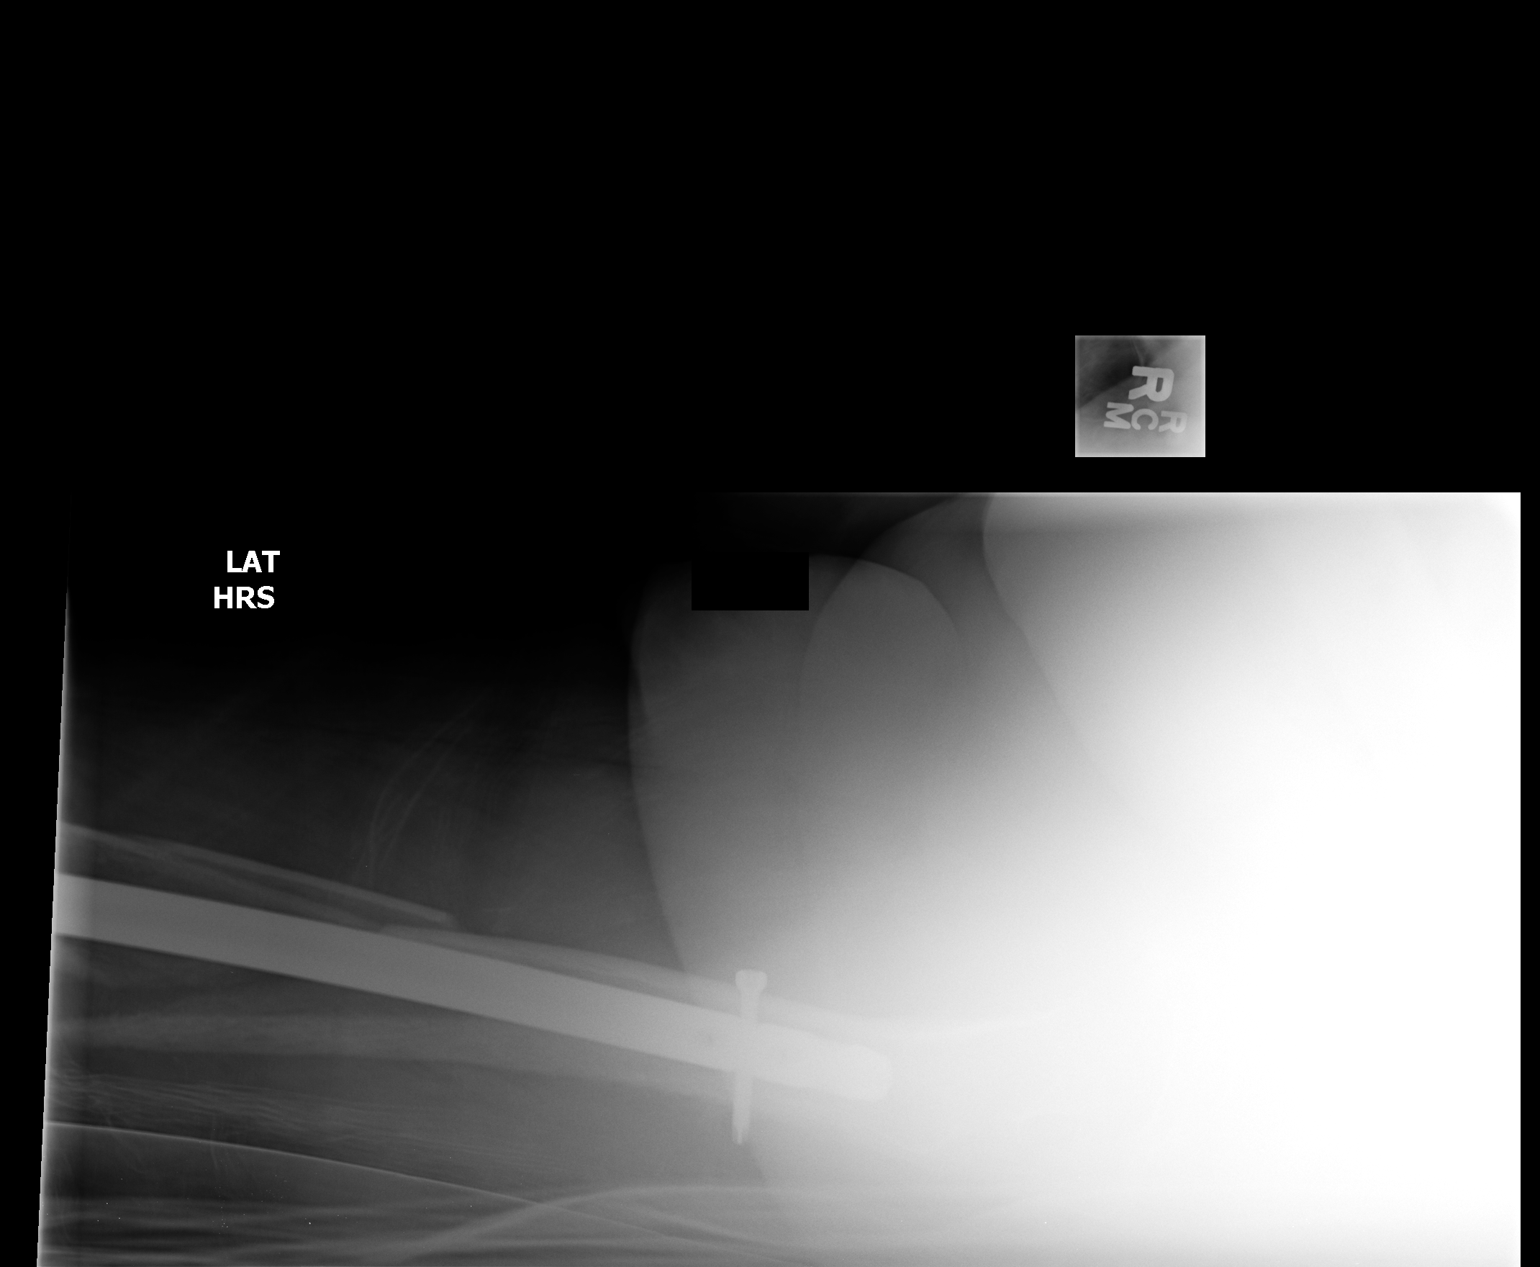

[1 of 1 positions shown; findings below may reference images not displayed]

FINDINGS: IM nail with single proximal and three distal locking screws
identified.
Fracture of the mid right femoral diaphysis with large butterfly
fragment again identified.
The fracture fragments are minimally displaced but crossed by the
IM nail.
No new bony abnormalities identified.
Expected surgical clips and soft tissue changes.
Bones diffusely demineralized.
IMPRESSION: Status post nailing of comminuted right femoral diaphyseal
fracture.

## 2011-04-30 IMAGING — CR DG HIP COMPLETE 2+V*R*
2 series · 2 of 2 positions shown · non-contrast
Comparison: none available

CLINICAL DATA: Fell

RIGHT HIP - COMPLETE 2+ VIEW

[t pelvis a.p. *]
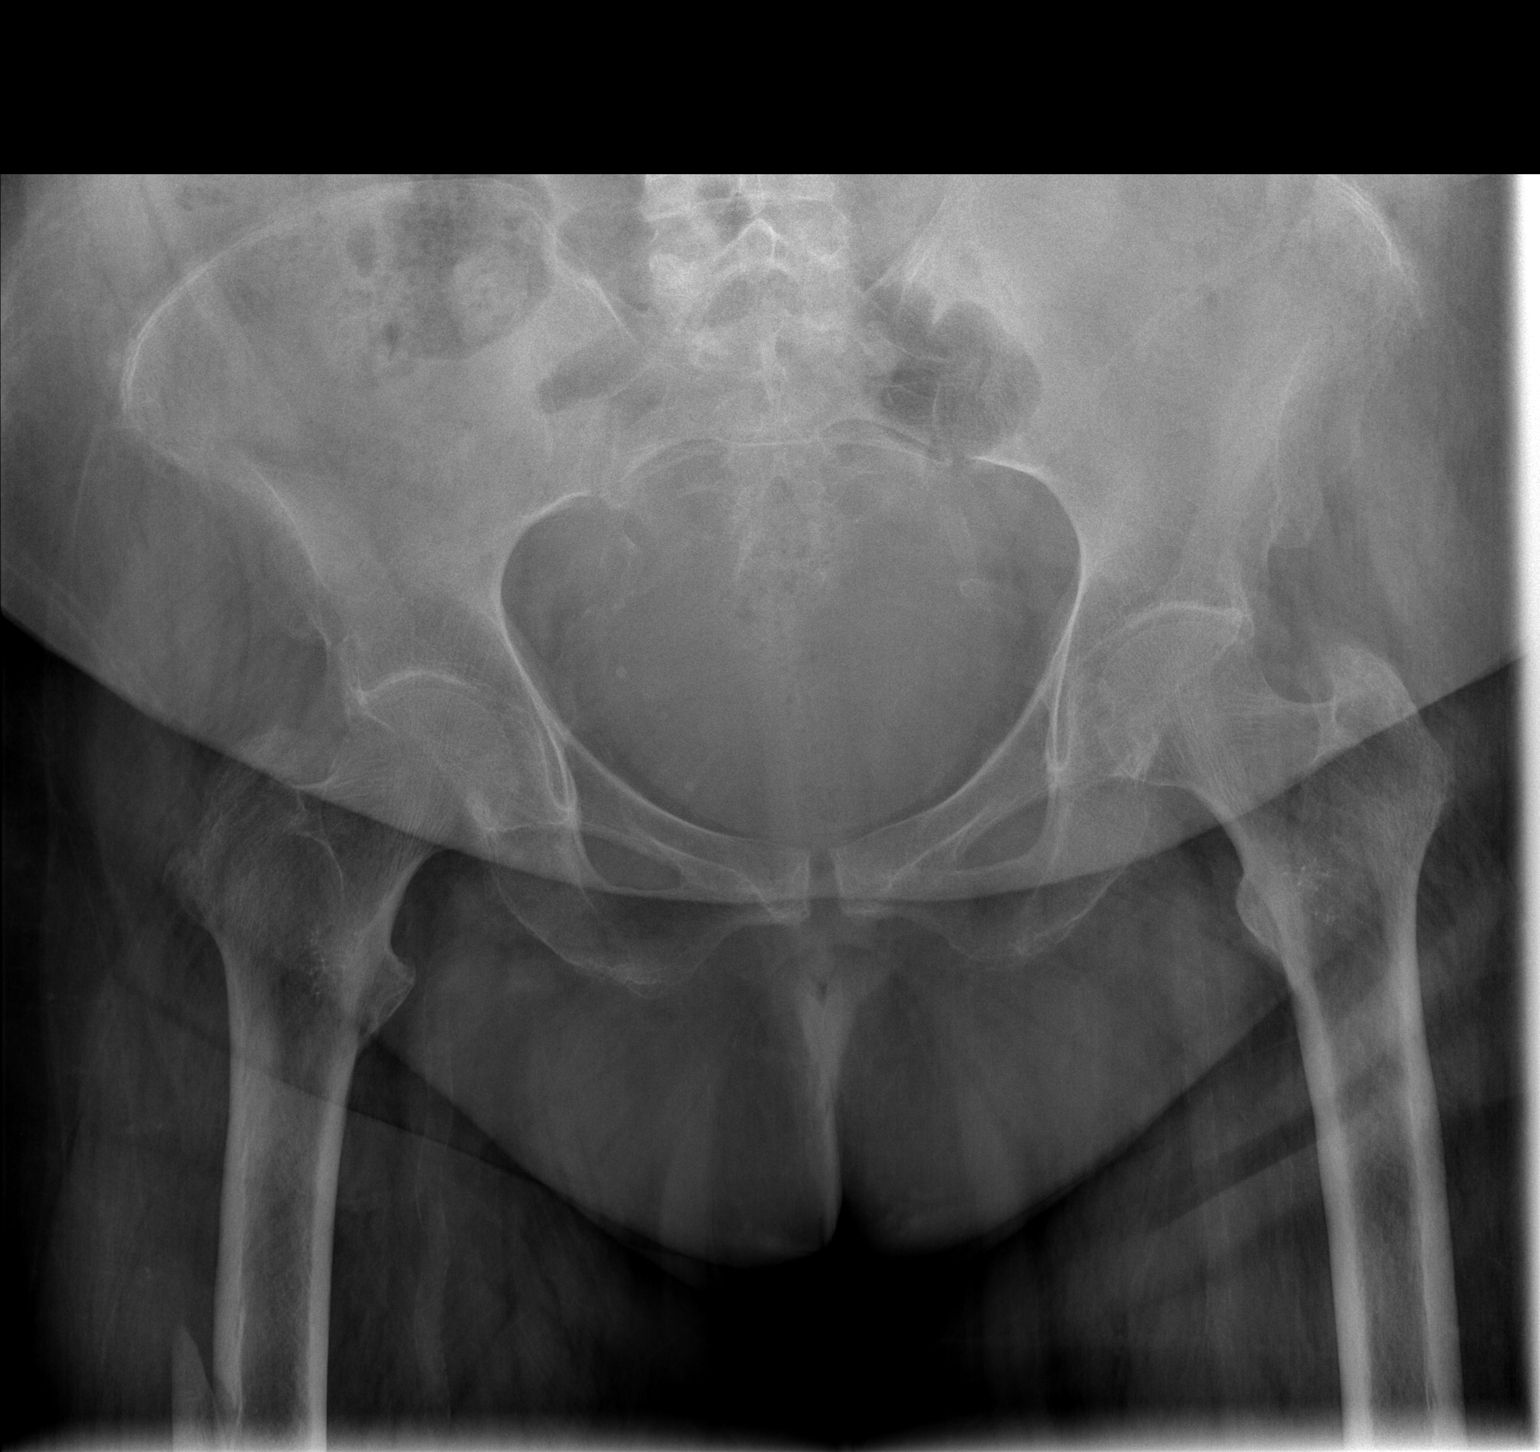

[t hip ap right *]
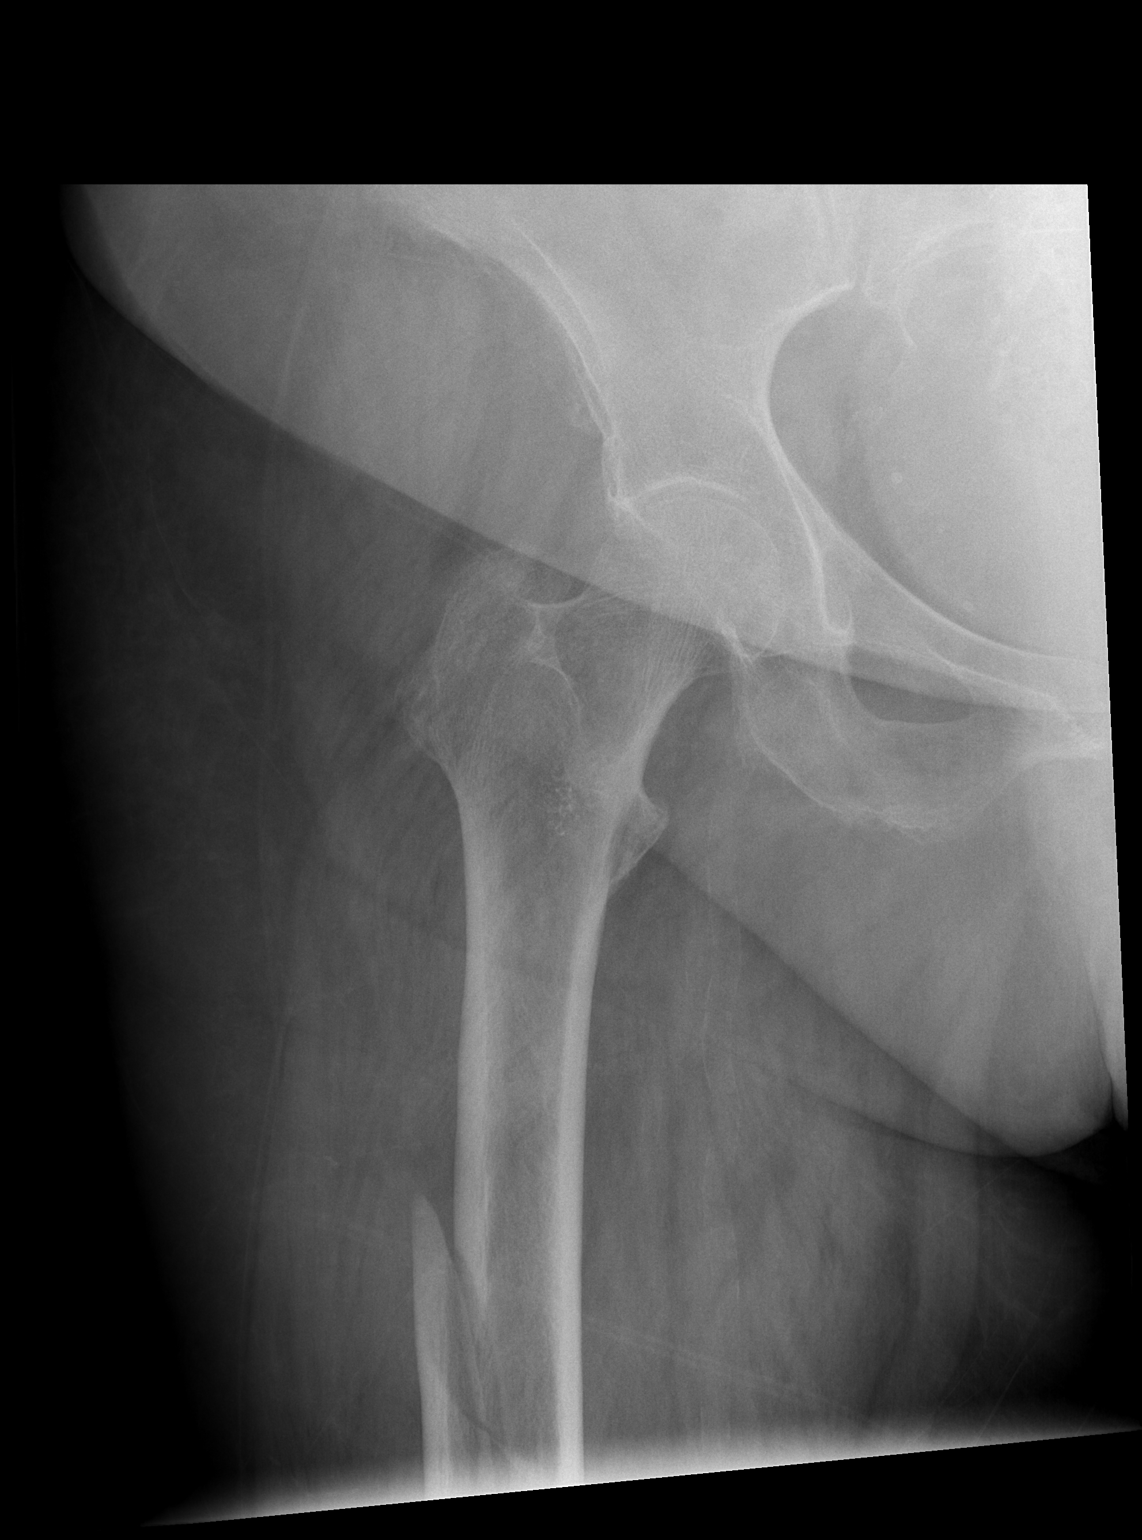

[2 of 2 positions shown; findings below may reference images not displayed]

FINDINGS: Osteopenia.  Fracture through the mid shaft of the femur,
incompletely visualized.  Hip appears intact.

Vascular calcifications noted in the pelvis.
IMPRESSION: 1.  Midshaft femur fracture, incompletely visualized.
2.  Negative pelvis

## 2015-03-02 DIAGNOSIS — E1165 Type 2 diabetes mellitus with hyperglycemia: Secondary | ICD-10-CM | POA: Diagnosis not present

## 2015-03-02 DIAGNOSIS — I739 Peripheral vascular disease, unspecified: Secondary | ICD-10-CM | POA: Diagnosis not present

## 2015-03-02 DIAGNOSIS — Z6839 Body mass index (BMI) 39.0-39.9, adult: Secondary | ICD-10-CM | POA: Diagnosis not present

## 2015-03-02 DIAGNOSIS — M069 Rheumatoid arthritis, unspecified: Secondary | ICD-10-CM | POA: Diagnosis not present

## 2015-03-02 DIAGNOSIS — R300 Dysuria: Secondary | ICD-10-CM | POA: Diagnosis not present

## 2015-03-02 DIAGNOSIS — Z789 Other specified health status: Secondary | ICD-10-CM | POA: Diagnosis not present

## 2015-03-12 DIAGNOSIS — N390 Urinary tract infection, site not specified: Secondary | ICD-10-CM | POA: Diagnosis not present

## 2015-03-12 DIAGNOSIS — Z886 Allergy status to analgesic agent status: Secondary | ICD-10-CM | POA: Diagnosis not present

## 2015-03-12 DIAGNOSIS — N200 Calculus of kidney: Secondary | ICD-10-CM | POA: Diagnosis not present

## 2015-03-12 DIAGNOSIS — N202 Calculus of kidney with calculus of ureter: Secondary | ICD-10-CM | POA: Diagnosis not present

## 2015-03-12 DIAGNOSIS — Z87442 Personal history of urinary calculi: Secondary | ICD-10-CM | POA: Diagnosis not present

## 2015-03-12 DIAGNOSIS — M069 Rheumatoid arthritis, unspecified: Secondary | ICD-10-CM | POA: Diagnosis not present

## 2015-03-12 DIAGNOSIS — E119 Type 2 diabetes mellitus without complications: Secondary | ICD-10-CM | POA: Diagnosis not present

## 2015-03-12 DIAGNOSIS — K219 Gastro-esophageal reflux disease without esophagitis: Secondary | ICD-10-CM | POA: Diagnosis not present

## 2015-03-12 DIAGNOSIS — Z882 Allergy status to sulfonamides status: Secondary | ICD-10-CM | POA: Diagnosis not present

## 2015-03-12 DIAGNOSIS — Z79899 Other long term (current) drug therapy: Secondary | ICD-10-CM | POA: Diagnosis not present

## 2015-03-12 DIAGNOSIS — R1031 Right lower quadrant pain: Secondary | ICD-10-CM | POA: Diagnosis not present

## 2015-03-12 DIAGNOSIS — N132 Hydronephrosis with renal and ureteral calculous obstruction: Secondary | ICD-10-CM | POA: Diagnosis not present

## 2015-03-12 DIAGNOSIS — Z91041 Radiographic dye allergy status: Secondary | ICD-10-CM | POA: Diagnosis not present

## 2015-03-14 DIAGNOSIS — Z880 Allergy status to penicillin: Secondary | ICD-10-CM | POA: Diagnosis not present

## 2015-03-14 DIAGNOSIS — K8080 Other cholelithiasis without obstruction: Secondary | ICD-10-CM | POA: Diagnosis not present

## 2015-03-14 DIAGNOSIS — I10 Essential (primary) hypertension: Secondary | ICD-10-CM | POA: Diagnosis not present

## 2015-03-14 DIAGNOSIS — E7800 Pure hypercholesterolemia, unspecified: Secondary | ICD-10-CM | POA: Diagnosis not present

## 2015-03-14 DIAGNOSIS — K219 Gastro-esophageal reflux disease without esophagitis: Secondary | ICD-10-CM | POA: Diagnosis not present

## 2015-03-14 DIAGNOSIS — Z888 Allergy status to other drugs, medicaments and biological substances status: Secondary | ICD-10-CM | POA: Diagnosis not present

## 2015-03-14 DIAGNOSIS — Z803 Family history of malignant neoplasm of breast: Secondary | ICD-10-CM | POA: Diagnosis not present

## 2015-03-14 DIAGNOSIS — N132 Hydronephrosis with renal and ureteral calculous obstruction: Secondary | ICD-10-CM | POA: Diagnosis not present

## 2015-03-14 DIAGNOSIS — E119 Type 2 diabetes mellitus without complications: Secondary | ICD-10-CM | POA: Diagnosis not present

## 2015-03-14 DIAGNOSIS — L4059 Other psoriatic arthropathy: Secondary | ICD-10-CM | POA: Diagnosis not present

## 2015-03-14 DIAGNOSIS — Z886 Allergy status to analgesic agent status: Secondary | ICD-10-CM | POA: Diagnosis not present

## 2015-03-14 DIAGNOSIS — K8020 Calculus of gallbladder without cholecystitis without obstruction: Secondary | ICD-10-CM | POA: Diagnosis not present

## 2015-03-14 DIAGNOSIS — R109 Unspecified abdominal pain: Secondary | ICD-10-CM | POA: Diagnosis not present

## 2015-03-14 DIAGNOSIS — K7460 Unspecified cirrhosis of liver: Secondary | ICD-10-CM | POA: Diagnosis not present

## 2015-03-14 DIAGNOSIS — Z801 Family history of malignant neoplasm of trachea, bronchus and lung: Secondary | ICD-10-CM | POA: Diagnosis not present

## 2015-03-15 DIAGNOSIS — N132 Hydronephrosis with renal and ureteral calculous obstruction: Secondary | ICD-10-CM | POA: Diagnosis not present

## 2015-03-15 DIAGNOSIS — K219 Gastro-esophageal reflux disease without esophagitis: Secondary | ICD-10-CM | POA: Diagnosis not present

## 2015-03-15 DIAGNOSIS — R109 Unspecified abdominal pain: Secondary | ICD-10-CM | POA: Diagnosis not present

## 2015-03-15 DIAGNOSIS — K8080 Other cholelithiasis without obstruction: Secondary | ICD-10-CM | POA: Diagnosis not present

## 2015-03-15 DIAGNOSIS — K7460 Unspecified cirrhosis of liver: Secondary | ICD-10-CM | POA: Diagnosis not present

## 2015-03-15 DIAGNOSIS — I10 Essential (primary) hypertension: Secondary | ICD-10-CM | POA: Diagnosis not present

## 2015-03-15 DIAGNOSIS — N201 Calculus of ureter: Secondary | ICD-10-CM | POA: Diagnosis not present

## 2015-03-15 DIAGNOSIS — Z880 Allergy status to penicillin: Secondary | ICD-10-CM | POA: Diagnosis not present

## 2015-03-15 DIAGNOSIS — N23 Unspecified renal colic: Secondary | ICD-10-CM | POA: Diagnosis not present

## 2015-03-15 DIAGNOSIS — Z801 Family history of malignant neoplasm of trachea, bronchus and lung: Secondary | ICD-10-CM | POA: Diagnosis not present

## 2015-03-15 DIAGNOSIS — L4059 Other psoriatic arthropathy: Secondary | ICD-10-CM | POA: Diagnosis not present

## 2015-03-15 DIAGNOSIS — E7800 Pure hypercholesterolemia, unspecified: Secondary | ICD-10-CM | POA: Diagnosis not present

## 2015-03-15 DIAGNOSIS — E119 Type 2 diabetes mellitus without complications: Secondary | ICD-10-CM | POA: Diagnosis not present

## 2015-03-15 DIAGNOSIS — N200 Calculus of kidney: Secondary | ICD-10-CM | POA: Diagnosis not present

## 2015-03-15 DIAGNOSIS — Z886 Allergy status to analgesic agent status: Secondary | ICD-10-CM | POA: Diagnosis not present

## 2015-03-15 DIAGNOSIS — Z888 Allergy status to other drugs, medicaments and biological substances status: Secondary | ICD-10-CM | POA: Diagnosis not present

## 2015-03-16 DIAGNOSIS — N132 Hydronephrosis with renal and ureteral calculous obstruction: Secondary | ICD-10-CM | POA: Diagnosis not present

## 2015-03-16 DIAGNOSIS — E119 Type 2 diabetes mellitus without complications: Secondary | ICD-10-CM | POA: Diagnosis not present

## 2015-03-16 DIAGNOSIS — I10 Essential (primary) hypertension: Secondary | ICD-10-CM | POA: Diagnosis not present

## 2015-03-16 DIAGNOSIS — E7800 Pure hypercholesterolemia, unspecified: Secondary | ICD-10-CM | POA: Diagnosis not present

## 2015-03-16 DIAGNOSIS — L4059 Other psoriatic arthropathy: Secondary | ICD-10-CM | POA: Diagnosis not present

## 2015-03-16 DIAGNOSIS — K219 Gastro-esophageal reflux disease without esophagitis: Secondary | ICD-10-CM | POA: Diagnosis not present

## 2015-03-16 DIAGNOSIS — R109 Unspecified abdominal pain: Secondary | ICD-10-CM | POA: Diagnosis not present

## 2015-03-22 DIAGNOSIS — N3941 Urge incontinence: Secondary | ICD-10-CM | POA: Diagnosis not present

## 2015-03-22 DIAGNOSIS — Z960 Presence of urogenital implants: Secondary | ICD-10-CM | POA: Diagnosis not present

## 2015-03-22 DIAGNOSIS — R351 Nocturia: Secondary | ICD-10-CM | POA: Diagnosis not present

## 2015-03-22 DIAGNOSIS — R3915 Urgency of urination: Secondary | ICD-10-CM | POA: Diagnosis not present

## 2015-03-23 DIAGNOSIS — M069 Rheumatoid arthritis, unspecified: Secondary | ICD-10-CM | POA: Diagnosis not present

## 2015-03-23 DIAGNOSIS — E1165 Type 2 diabetes mellitus with hyperglycemia: Secondary | ICD-10-CM | POA: Diagnosis not present

## 2015-03-23 DIAGNOSIS — I10 Essential (primary) hypertension: Secondary | ICD-10-CM | POA: Diagnosis not present

## 2015-03-23 DIAGNOSIS — I739 Peripheral vascular disease, unspecified: Secondary | ICD-10-CM | POA: Diagnosis not present

## 2015-03-24 DIAGNOSIS — L4059 Other psoriatic arthropathy: Secondary | ICD-10-CM | POA: Diagnosis not present

## 2015-03-24 DIAGNOSIS — L409 Psoriasis, unspecified: Secondary | ICD-10-CM | POA: Diagnosis not present

## 2015-03-24 DIAGNOSIS — E559 Vitamin D deficiency, unspecified: Secondary | ICD-10-CM | POA: Diagnosis not present

## 2015-03-28 ENCOUNTER — Encounter: Payer: Self-pay | Admitting: Internal Medicine

## 2015-04-11 ENCOUNTER — Ambulatory Visit (INDEPENDENT_AMBULATORY_CARE_PROVIDER_SITE_OTHER): Payer: Medicare Other | Admitting: Gastroenterology

## 2015-04-11 ENCOUNTER — Encounter: Payer: Self-pay | Admitting: Gastroenterology

## 2015-04-11 VITALS — BP 150/74 | HR 92 | Temp 97.8°F | Ht 59.0 in | Wt 170.0 lb

## 2015-04-11 DIAGNOSIS — K7460 Unspecified cirrhosis of liver: Secondary | ICD-10-CM | POA: Diagnosis not present

## 2015-04-11 DIAGNOSIS — K5900 Constipation, unspecified: Secondary | ICD-10-CM | POA: Diagnosis not present

## 2015-04-11 MED ORDER — LUBIPROSTONE 24 MCG PO CAPS: 24.0000 ug | ORAL_CAPSULE | Freq: Two times a day (BID) | ORAL | Status: DC

## 2015-04-11 NOTE — Assessment & Plan Note (Signed)
Discussed options of MiraLAX daily or trial of Amitiza 24 g twice daily with food. Patient may try MiraLAX first but wanted to go ahead and get some samples and a prescription for the Amitiza. Call with any further problems.  We did discuss whether or not she is up-to-date on colonoscopy. Patient states that she will not have another colonoscopy. She is avoiding all invasive procedures.

## 2015-04-11 NOTE — Assessment & Plan Note (Signed)
Recent diagnosis of cirrhosis based on imaging. Likely [name] cirrhosis in the setting of chronic diabetes and obesity. Patient is requesting no invasive procedures. We discussed recommendations for upper endoscopy to screen for esophageal varices but she declines. She is willing to go for blood work as long as they'll have to stick her multiple times. We will check for hepatitis A and B immune status, screen for most common other potential etiologies for cirrhosis. She is not sure whether or not she will pursue biannual abdominal ultrasound for hepatoma screening. Discussed need for controlling diabetes, slow gradual weight loss.  Current meld 9, child Pugh class A indicating good hepatic function at this point.  Return to the office in 6 months or sooner if needed.

## 2015-04-11 NOTE — Patient Instructions (Signed)
Please have your labs done.  Trial of Amitiza twice daily with food for constipation. Samples provided. RX sent to your pharmacy.  As discussed today, we will hold off on upper endoscopy to screen for varices and colonoscopy at your request.  Consider having labs and liver ultrasound twice per year.  Return to the office in six months or sooner if needed.   Cirrhosis Cirrhosis is long-term (chronic) liver injury. The liver is your largest internal organ, and it performs many functions. The liver converts food into energy, removes toxic material from your blood, makes important proteins, and absorbs necessary vitamins from your diet. If you have cirrhosis, it means many of your healthy liver cells have been replaced by scar tissue. This prevents blood from flowing through your liver, which makes it difficult for your liver to function. This scarring is not reversible, but treatment can prevent it from getting worse.  CAUSES  Hepatitis C and long-term alcohol abuse are the most common causes of cirrhosis. Other causes include:  Nonalcoholic fatty liver disease.  Hepatitis B infection.  Autoimmune hepatitis.  Diseases that cause blockage of ducts inside the liver.  Inherited liver diseases.  Reactions to certain long-term medicines.  Parasitic infections.  Long-term exposure to certain toxins. RISK FACTORS You may have a higher risk of cirrhosis if you:  Have certain hepatitis viruses.  Abuse alcohol, especially if you are female.  Are overweight.  Share needles.  Have unprotected sex with someone who has hepatitis. SYMPTOMS  You may not have any signs and symptoms at first. Symptoms may not develop until the damage to your liver starts to get worse. Signs and symptoms of cirrhosis may include:   Tenderness in the right-upper part of your abdomen.  Weakness and tiredness (fatigue).  Loss of appetite.  Nausea.  Weight loss and muscle loss.  Itchiness.  Yellow  skin and eyes (jaundice).  Buildup of fluid in the abdomen (ascites).  Swelling of the feet and ankles (edema).  Appearance of tiny blood vessels under the skin.  Mental confusion.  Easy bruising and bleeding. DIAGNOSIS  Your health care provider may suspect cirrhosis based on your symptoms and medical history, especially if you have other medical conditions or a history of alcohol abuse. Your health care provider will do a physical exam to feel your liver and check for signs of cirrhosis. Your health care provider may perform other tests, including:   Blood tests to check:   Whether you have hepatitis B or C.   Kidney function.  Liver function.  Imaging tests such as:  MRI or CT scan to look for changes seen in advanced cirrhosis.  Ultrasound to see if normal liver tissue is being replaced by scar tissue.  A procedure using a long needle to take a sample of liver tissue (biopsy) for examination under a microscope. Liver biopsy can confirm the diagnosis of cirrhosis.  TREATMENT  Treatment depends on how damaged your liver is and what caused the damage. Treatment may include treating cirrhosis symptoms or treating the underlying causes of the condition to try to slow the progression of the damage. Treatment may include:  Making lifestyle changes, such as:   Eating a healthy diet.  Restricting salt intake.  Maintaining a healthy weight.   Not abusing drugs or alcohol.  Taking medicines to:  Treat liver infections or other infections.  Control itching.  Reduce fluid buildup.  Reduce certain blood toxins.  Reduce risk of bleeding from enlarged blood vessels in  the stomach or esophagus (varices).  If varices are causing bleeding problems, you may need treatment with a procedure that ties up the vessels causing them to fall off (band ligation).  If cirrhosis is causing your liver to fail, your health care provider may recommend a liver transplant.  Other  treatments may be recommended depending on any complications of cirrhosis, such as liver-related kidney failure (hepatorenal syndrome). HOME CARE INSTRUCTIONS   Take medicines only as directed by your health care provider. Do not use drugs that are toxic to your liver. Ask your health care provider before taking any new medicines, including over-the-counter medicines.   Rest as needed.  Eat a well-balanced diet. Ask your health care provider or dietitian for more information.   You may have to follow a low-salt diet or restrict your water intake as directed.  Do not drink alcohol. This is especially important if you are taking acetaminophen.  Keep all follow-up visits as directed by your health care provider. This is important. SEEK MEDICAL CARE IF:  You have fatigue or weakness that is getting worse.  You develop swelling of the hands, feet, legs, or face.  You have a fever.  You develop loss of appetite.  You have nausea or vomiting.  You develop jaundice.  You develop easy bruising or bleeding. SEEK IMMEDIATE MEDICAL CARE IF:  You vomit bright red blood or a material that looks like coffee grounds.  You have blood in your stools.  Your stools appear black and tarry.  You become confused.  You have chest pain or trouble breathing.   This information is not intended to replace advice given to you by your health care provider. Make sure you discuss any questions you have with your health care provider.   Document Released: 02/05/2005 Document Revised: 02/26/2014 Document Reviewed: 10/14/2013 Elsevier Interactive Patient Education [employer].

## 2015-04-11 NOTE — Progress Notes (Signed)
Primary Care Physician:  Glenda Chroman., MD  Primary Gastroenterologist:  Garfield Cornea, MD   Chief Complaint  Patient presents with  . Cirrhosis    HPI:  [name] is a 70 y.o. female here at the request of Dr. Woody Seller for further evaluation of cirrhosis. Patient was hospitalized last month with ureteric stone requiring stent placement with subsequent removal. During her work-up she was incidentally noted to have cirrhosis with splenomegaly on a noncontrast CT.   Patient and husband state they were shocked to hear she has cirrhosis. She is tired of dealing with chronic medical illnesses. She has decided to avoid invasive testing. Husband confirms that that is what she wants and is her decision. Complains of diminished appetite for one year. Watching diet. Has has intentional weight loss of 20 pounds per patient. No h/o etoh abuse. No family history of cirrhosis. She denies abdominal pain. Some constipation. Trying stool softeners without relief. Intermittent miralax helps some. No melena, brbpr. No heartburn or n/v. No dysphagia.   She reports being a hard stick and likes to go to [place] in [place].  Current Outpatient Prescriptions  Medication Sig Dispense Refill  . Apremilast (OTEZLA) 30 MG TABS Take 30 mg by mouth 2 (two) times daily.    Marland Kitchen aspirin EC 81 MG tablet Take 81 mg by mouth daily.      . cilostazol (PLETAL) 50 MG tablet Take 50 mg by mouth 2 (two) times daily.      . clotrimazole-betamethasone (LOTRISONE) cream Apply 1 application topically 2 (two) times daily. As needed    . raloxifene (EVISTA) 60 MG tablet Take 60 mg by mouth daily.      Marland Kitchen torsemide (DEMADEX) 20 MG tablet Take 20 mg by mouth daily as needed. For fluid    . Vitamin D, Ergocalciferol, (DRISDOL) 50000 UNITS CAPS Take 50,000 Units by mouth every 7 (seven) days. On fridays      No current facility-administered medications for this visit.    Allergies as of 04/11/2015 - Review Complete 04/11/2015  Allergen Reaction  Noted  . Benadryl [diphenhydramine hcl] Other (See Comments) 12/13/2010  . Sulfa antibiotics Other (See Comments) 12/13/2010  . Aspirin Rash and Other (See Comments) 12/13/2010  . Penicillins Rash and Other (See Comments) 12/13/2010    Past Medical History  Diagnosis Date  . TIA (transient ischemic attack) 2006  . GERD (gastroesophageal reflux disease)   . Rheumatoid arthritis, adult ([place])   . Asthma   . Diabetes mellitus   . Hyperlipidemia   . Hypertension   . Anxiety   . IBS (irritable bowel syndrome)   . Psoriasis   . 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 Tonsillectomy    . Abdominal hysterectomy    . Lipoma excision  1987    from back  . Bunionectomy  1983    both feet  . Hemorroidectomy  1965  . Cardiac catheterization  1997  . Fracture surgery  2011    right leg/knee, rod/pins  . Cataract extraction w/phaco  12/21/2010    Procedure: CATARACT EXTRACTION PHACO AND INTRAOCULAR LENS PLACEMENT (IOC);  Surgeon: Tonny Branch;  Location: [facility];  Service: Ophthalmology;  Laterality: Left;  CDE: 10.44  . Cataract extraction w/phaco  01/15/2011    Procedure: CATARACT EXTRACTION PHACO AND INTRAOCULAR LENS  PLACEMENT (IOC);  Surgeon: Tonny Branch;  Location: [facility];  Service: Ophthalmology;  Laterality: Right;  CDE:9.37  . Esophagogastroduodenoscopy      remote EGD/ED by Dr. Gala Romney at [facility], around 2000, patient refuses future EGD.  Marland Kitchen Colonoscopy      several in remote past, refuses future colonoscopy    Family History  Problem Relation Age of Onset  . Anesthesia problems Neg Hx   . Liver disease Mother     ?metastatic, cancer in lung, breast, bone, liver  . Lung cancer Father   . Breast cancer Sister   . Kidney cancer Sister     Social History   Social History  . Marital Status: Married    Spouse Name: N/A  . Number of Children: N/A  . Years of  Education: N/A   Occupational History  . Not on file.   Social History Main Topics  . Smoking status: Never Smoker   . Smokeless tobacco: Not on file     Comment: Never smoked  . Alcohol Use: No  . Drug Use: No  . Sexual Activity: Not on file   Other Topics Concern  . Not on file   Social History Narrative      ROS:  General: Negative for unintentional weight loss, fever, chills, fatigue, weakness. Intermittent poor appetite. Eyes: Negative for vision changes.  ENT: Negative for hoarseness, difficulty swallowing , nasal congestion. CV: Negative for chest pain, angina, palpitations, dyspnea on exertion, mild peripheral edema.  Respiratory: Negative for dyspnea at rest, dyspnea on exertion, cough, sputum, wheezing.  GI: See history of present illness. GU:  Negative for dysuria, hematuria, urinary incontinence, urinary frequency, nocturnal urination.  MS: Negative for low back pain. Chronic joint pain. Derm: Negative for rash or itching.  Neuro: Negative for weakness, abnormal sensation, seizure, frequent headaches, memory loss, confusion.  Psych: Negative for anxiety, depression, suicidal ideation, hallucinations.  Endo: Negative for unusual weight change.  Heme: Negative for bruising or bleeding. Allergy: Negative for rash or hives.    Physical Examination:  BP 150/74 mmHg  Pulse 92  Temp(Src) 97.8 F (36.6 C) (Oral)  Ht 4\' 11"  (1.499 m)  Wt 170 lb (77.111 kg)  BMI 34.32 kg/m2   General: Well-nourished, well-developed in no acute distress. Accompanied by husband. Head: Normocephalic, atraumatic.   Eyes: Conjunctiva pink, no icterus. Mouth: Oropharyngeal mucosa moist and pink , no lesions erythema or exudate. Neck: Supple without thyromegaly, masses, or lymphadenopathy.  Lungs: Clear to auscultation bilaterally.  Heart: Regular rate and rhythm, no murmurs rubs or gallops.  Abdomen: Examined in wheelchair per patient's request. Bowel sounds are normal, nontender,  rectus diastases. Cannot appreciate hepatosplenomegaly or masses in current position.no abdominal bruits, no rebound or guarding.   Rectal: Not performed Extremities: No lower extremity edema. No clubbing or deformities. Chronic dermatitis. Psoriasis. Both hands with joint deformities related to RA Neuro: Alert and oriented x 4 , grossly normal neurologically.  Skin: Warm and dry, no rash or jaundice.   Psych: Alert and cooperative, normal mood and affect.  Labs: Labs 03/15/15 Glucose 146, BUN 13, Cre 1.24, Tbili 0.4, AP 102, AST 20.4, ALT 13, albumin 3.0, calcium 9.0, lipase 44, INR 1.1, WBC 8100, hemoglobin 11.1 low, hematocrit 36.8, MCV 88.5, platelets 190,000  Imaging Studies: CT of abdomen pelvis without contrast on 03/12/2015 Liver contour is slightly irregular suggesting underlying cirrhosis. Cholelithiasis. Enlarged spleen. 4 mm stone within the distal right ureter just proximal to the right UVJ with moderate hydronephrosis and perinephric  edema. Additional 3 mm stone within the right renal pelvis. Several nonobstructing stones within the left renal pelvis. Small periumbilical abdominal wall hernia containing fat only.

## 2015-04-11 NOTE — Progress Notes (Signed)
CC'ED TO PCP 

## 2015-04-12 DIAGNOSIS — K5900 Constipation, unspecified: Secondary | ICD-10-CM | POA: Diagnosis not present

## 2015-04-12 DIAGNOSIS — K7460 Unspecified cirrhosis of liver: Secondary | ICD-10-CM | POA: Diagnosis not present

## 2015-04-13 ENCOUNTER — Telehealth: Payer: Self-pay

## 2015-04-13 LAB — MITOCHONDRIAL/SMOOTH MUSCLE AB PNL
Mitochondrial Ab: 3.7 Units (ref 0.0–20.0)
SMOOTH MUSCLE AB: 19 U (ref 0–19)

## 2015-04-13 LAB — IGG, IGA, IGM
IGA/IMMUNOGLOBULIN A, SERUM: 611 mg/dL — AB (ref 87–352)
IGG (IMMUNOGLOBIN G), SERUM: 2178 mg/dL — AB (ref 700–1600)
IGM (IMMUNOGLOBULIN M), SRM: 325 mg/dL — AB (ref 26–217)

## 2015-04-13 LAB — HEPATITIS B SURFACE ANTIBODY,QUALITATIVE: HEP B SURFACE AB, QUAL: NONREACTIVE

## 2015-04-13 LAB — HEPATITIS B SURFACE ANTIGEN: HEP B S AG: NEGATIVE

## 2015-04-13 LAB — ANA: ANA: NEGATIVE

## 2015-04-13 LAB — IRON AND TIBC
Iron Saturation: 16 % (ref 15–55)
Iron: 39 ug/dL (ref 27–139)
Total Iron Binding Capacity: 245 ug/dL — ABNORMAL LOW (ref 250–450)
UIBC: 206 ug/dL (ref 118–369)

## 2015-04-13 LAB — HEPATITIS C ANTIBODY

## 2015-04-13 LAB — FERRITIN: Ferritin: 66 ng/mL (ref 15–150)

## 2015-04-13 LAB — HEPATITIS A ANTIBODY, TOTAL: Hep A Total Ab: NEGATIVE

## 2015-04-13 NOTE — Telephone Encounter (Signed)
Lab results from [facility] placed on [facility] Desk for review 

## 2015-04-14 ENCOUNTER — Telehealth: Payer: Self-pay

## 2015-04-14 NOTE — Telephone Encounter (Signed)
Lab results from [place] placed on [facility] desk

## 2015-04-25 ENCOUNTER — Telehealth: Payer: Self-pay | Admitting: Internal Medicine

## 2015-04-25 NOTE — Telephone Encounter (Signed)
Pt said she had labs done 2 weeks ago and was calling for her results. Please call [phone]

## 2015-04-25 NOTE — Telephone Encounter (Signed)
Routing to [facility] 

## 2015-04-25 NOTE — Progress Notes (Signed)
Quick Note:  Please let patient know her labs showed: No hepatitis B or C. She should consider vaccination to Hep A and B because she is not immune and this could protect liver from infection from these viruses. Her immunoglobulins are up but this could be from multiple things, autoimmune disorders, different blood disorders being couple of causes. I doubt autoimmune hepatitis. What we would normally do is offer further testing (labs) to look into this or repeat in 2 months. She does not need a liver biopsy at this time.   Suspecting fatty liver as etiology of her cirrhosis.   Let me know if she wants to work up the elevated immunoglobulins. ______

## 2015-04-25 NOTE — Telephone Encounter (Signed)
See result note.  

## 2015-04-27 NOTE — Progress Notes (Signed)
Quick Note:  LFTs, IgA/IgM/IgG in 2 months. ______

## 2015-04-28 ENCOUNTER — Other Ambulatory Visit: Payer: Self-pay | Admitting: Gastroenterology

## 2015-04-28 DIAGNOSIS — K7460 Unspecified cirrhosis of liver: Secondary | ICD-10-CM

## 2015-05-04 DIAGNOSIS — M069 Rheumatoid arthritis, unspecified: Secondary | ICD-10-CM | POA: Diagnosis not present

## 2015-05-04 DIAGNOSIS — E1165 Type 2 diabetes mellitus with hyperglycemia: Secondary | ICD-10-CM | POA: Diagnosis not present

## 2015-05-04 DIAGNOSIS — I10 Essential (primary) hypertension: Secondary | ICD-10-CM | POA: Diagnosis not present

## 2015-05-04 DIAGNOSIS — K7460 Unspecified cirrhosis of liver: Secondary | ICD-10-CM | POA: Diagnosis not present

## 2015-05-17 DIAGNOSIS — R3915 Urgency of urination: Secondary | ICD-10-CM | POA: Diagnosis not present

## 2015-05-17 DIAGNOSIS — N3941 Urge incontinence: Secondary | ICD-10-CM | POA: Diagnosis not present

## 2015-05-17 DIAGNOSIS — R351 Nocturia: Secondary | ICD-10-CM | POA: Diagnosis not present

## 2015-05-30 DIAGNOSIS — E1165 Type 2 diabetes mellitus with hyperglycemia: Secondary | ICD-10-CM | POA: Diagnosis not present

## 2015-05-30 DIAGNOSIS — I739 Peripheral vascular disease, unspecified: Secondary | ICD-10-CM | POA: Diagnosis not present

## 2015-05-30 DIAGNOSIS — K7460 Unspecified cirrhosis of liver: Secondary | ICD-10-CM | POA: Diagnosis not present

## 2015-05-31 DIAGNOSIS — E1151 Type 2 diabetes mellitus with diabetic peripheral angiopathy without gangrene: Secondary | ICD-10-CM | POA: Diagnosis not present

## 2015-05-31 DIAGNOSIS — B351 Tinea unguium: Secondary | ICD-10-CM | POA: Diagnosis not present

## 2015-05-31 DIAGNOSIS — L84 Corns and callosities: Secondary | ICD-10-CM | POA: Diagnosis not present

## 2015-06-15 ENCOUNTER — Other Ambulatory Visit: Payer: Self-pay

## 2015-06-15 DIAGNOSIS — K7460 Unspecified cirrhosis of liver: Secondary | ICD-10-CM

## 2015-06-27 ENCOUNTER — Other Ambulatory Visit: Payer: Self-pay | Admitting: Gastroenterology

## 2015-06-27 DIAGNOSIS — K7460 Unspecified cirrhosis of liver: Secondary | ICD-10-CM | POA: Diagnosis not present

## 2015-06-28 ENCOUNTER — Telehealth: Payer: Self-pay | Admitting: Internal Medicine

## 2015-06-28 DIAGNOSIS — E1165 Type 2 diabetes mellitus with hyperglycemia: Secondary | ICD-10-CM | POA: Diagnosis not present

## 2015-06-28 DIAGNOSIS — I10 Essential (primary) hypertension: Secondary | ICD-10-CM | POA: Diagnosis not present

## 2015-06-28 DIAGNOSIS — K7460 Unspecified cirrhosis of liver: Secondary | ICD-10-CM | POA: Diagnosis not present

## 2015-06-28 DIAGNOSIS — T320 Corrosions involving less than 10% of body surface: Secondary | ICD-10-CM | POA: Diagnosis not present

## 2015-06-28 DIAGNOSIS — Z418 Encounter for other procedures for purposes other than remedying health state: Secondary | ICD-10-CM | POA: Diagnosis not present

## 2015-06-28 LAB — HEPATIC FUNCTION PANEL
ALT: 12 IU/L (ref 0–32)
AST: 23 IU/L (ref 0–40)
Albumin: 3.5 g/dL (ref 3.5–4.8)
Alkaline Phosphatase: 108 IU/L (ref 39–117)
Bilirubin Total: 0.3 mg/dL (ref 0.0–1.2)
Bilirubin, Direct: 0.08 mg/dL (ref 0.00–0.40)
TOTAL PROTEIN: 7.5 g/dL (ref 6.0–8.5)

## 2015-06-28 LAB — IMMUNOGLOBULINS A/E/G/M, SERUM
IGA/IMMUNOGLOBULIN A, SERUM: 553 mg/dL — AB (ref 64–422)
IGE (IMMUNOGLOBULIN E), SERUM: 987 [IU]/mL — AB (ref 0–100)
IGG (IMMUNOGLOBIN G), SERUM: 2209 mg/dL — AB (ref 700–1600)
IGM (IMMUNOGLOBULIN M), SRM: 290 mg/dL — AB (ref 26–217)

## 2015-06-28 NOTE — Telephone Encounter (Signed)
Routing to [facility] 

## 2015-06-28 NOTE — Telephone Encounter (Signed)
Pt called to let JL know that she had her labs done Saturday and they are to fax them to [place]. She wanted to know the results. Please call [phone]

## 2015-06-30 ENCOUNTER — Telehealth: Payer: Self-pay | Admitting: Internal Medicine

## 2015-06-30 NOTE — Telephone Encounter (Signed)
Pt called saying she needed to know her lab results and needed to know today. She was told after the provider reviews and signs off on the labs the nurse would contact her. [phone]

## 2015-06-30 NOTE — Telephone Encounter (Signed)
Routing to [facility] 

## 2015-07-01 NOTE — Telephone Encounter (Signed)
See result note.  

## 2015-07-01 NOTE — Progress Notes (Signed)
Quick Note:  Please let patient know her immunoglobulins remain elevated as similar levels as to 2 months ago. Unclear if related to her autoimmune diseases, ie RA or other underlying undiagnosed etiology.  Her LFTs are actually normal.  I would like for her to see hematologist at least once to review and make further recommendations regarding these labs. ______

## 2015-07-05 NOTE — Telephone Encounter (Signed)
I spoke with the pt, see results note.

## 2015-07-19 ENCOUNTER — Other Ambulatory Visit: Payer: Self-pay

## 2015-07-19 MED ORDER — LUBIPROSTONE 24 MCG PO CAPS: 24.0000 ug | ORAL_CAPSULE | Freq: Two times a day (BID) | ORAL | Status: AC

## 2015-07-22 DIAGNOSIS — L409 Psoriasis, unspecified: Secondary | ICD-10-CM | POA: Diagnosis not present

## 2015-07-22 DIAGNOSIS — M7989 Other specified soft tissue disorders: Secondary | ICD-10-CM | POA: Diagnosis not present

## 2015-07-22 DIAGNOSIS — R110 Nausea: Secondary | ICD-10-CM | POA: Diagnosis not present

## 2015-07-22 DIAGNOSIS — E559 Vitamin D deficiency, unspecified: Secondary | ICD-10-CM | POA: Diagnosis not present

## 2015-07-22 DIAGNOSIS — L4059 Other psoriatic arthropathy: Secondary | ICD-10-CM | POA: Diagnosis not present

## 2015-08-17 DIAGNOSIS — E7800 Pure hypercholesterolemia, unspecified: Secondary | ICD-10-CM | POA: Diagnosis not present

## 2015-08-17 DIAGNOSIS — R5383 Other fatigue: Secondary | ICD-10-CM | POA: Diagnosis not present

## 2015-08-17 DIAGNOSIS — E1165 Type 2 diabetes mellitus with hyperglycemia: Secondary | ICD-10-CM | POA: Diagnosis not present

## 2015-08-17 DIAGNOSIS — Z0000 Encounter for general adult medical examination without abnormal findings: Secondary | ICD-10-CM | POA: Diagnosis not present

## 2015-08-17 DIAGNOSIS — Z1389 Encounter for screening for other disorder: Secondary | ICD-10-CM | POA: Diagnosis not present

## 2015-08-17 DIAGNOSIS — Z1211 Encounter for screening for malignant neoplasm of colon: Secondary | ICD-10-CM | POA: Diagnosis not present

## 2015-08-17 DIAGNOSIS — Z299 Encounter for prophylactic measures, unspecified: Secondary | ICD-10-CM | POA: Diagnosis not present

## 2015-08-17 DIAGNOSIS — Z7189 Other specified counseling: Secondary | ICD-10-CM | POA: Diagnosis not present

## 2015-08-17 DIAGNOSIS — Z79899 Other long term (current) drug therapy: Secondary | ICD-10-CM | POA: Diagnosis not present

## 2015-08-17 DIAGNOSIS — E559 Vitamin D deficiency, unspecified: Secondary | ICD-10-CM | POA: Diagnosis not present

## 2015-08-17 DIAGNOSIS — I739 Peripheral vascular disease, unspecified: Secondary | ICD-10-CM | POA: Diagnosis not present

## 2015-08-18 DIAGNOSIS — E7800 Pure hypercholesterolemia, unspecified: Secondary | ICD-10-CM | POA: Diagnosis not present

## 2015-08-18 DIAGNOSIS — M069 Rheumatoid arthritis, unspecified: Secondary | ICD-10-CM | POA: Diagnosis not present

## 2015-08-18 DIAGNOSIS — I10 Essential (primary) hypertension: Secondary | ICD-10-CM | POA: Diagnosis not present

## 2015-08-18 DIAGNOSIS — E119 Type 2 diabetes mellitus without complications: Secondary | ICD-10-CM | POA: Diagnosis not present

## 2015-08-29 ENCOUNTER — Encounter: Payer: Self-pay | Admitting: Internal Medicine

## 2015-09-07 DIAGNOSIS — E7800 Pure hypercholesterolemia, unspecified: Secondary | ICD-10-CM | POA: Diagnosis not present

## 2015-09-07 DIAGNOSIS — I10 Essential (primary) hypertension: Secondary | ICD-10-CM | POA: Diagnosis not present

## 2015-09-07 DIAGNOSIS — E119 Type 2 diabetes mellitus without complications: Secondary | ICD-10-CM | POA: Diagnosis not present

## 2015-09-07 DIAGNOSIS — M069 Rheumatoid arthritis, unspecified: Secondary | ICD-10-CM | POA: Diagnosis not present

## 2015-09-27 DIAGNOSIS — B351 Tinea unguium: Secondary | ICD-10-CM | POA: Diagnosis not present

## 2015-09-27 DIAGNOSIS — L84 Corns and callosities: Secondary | ICD-10-CM | POA: Diagnosis not present

## 2015-09-27 DIAGNOSIS — E1151 Type 2 diabetes mellitus with diabetic peripheral angiopathy without gangrene: Secondary | ICD-10-CM | POA: Diagnosis not present

## 2015-10-25 DIAGNOSIS — J209 Acute bronchitis, unspecified: Secondary | ICD-10-CM | POA: Diagnosis not present

## 2015-11-04 DIAGNOSIS — H35373 Puckering of macula, bilateral: Secondary | ICD-10-CM | POA: Diagnosis not present

## 2015-11-04 DIAGNOSIS — Z961 Presence of intraocular lens: Secondary | ICD-10-CM | POA: Diagnosis not present

## 2015-11-04 DIAGNOSIS — H04123 Dry eye syndrome of bilateral lacrimal glands: Secondary | ICD-10-CM | POA: Diagnosis not present

## 2015-11-04 DIAGNOSIS — Z9849 Cataract extraction status, unspecified eye: Secondary | ICD-10-CM | POA: Diagnosis not present

## 2015-11-21 DIAGNOSIS — E559 Vitamin D deficiency, unspecified: Secondary | ICD-10-CM | POA: Diagnosis not present

## 2015-11-21 DIAGNOSIS — R110 Nausea: Secondary | ICD-10-CM | POA: Diagnosis not present

## 2015-11-21 DIAGNOSIS — L4059 Other psoriatic arthropathy: Secondary | ICD-10-CM | POA: Diagnosis not present

## 2015-11-21 DIAGNOSIS — L409 Psoriasis, unspecified: Secondary | ICD-10-CM | POA: Diagnosis not present

## 2015-11-23 DIAGNOSIS — Z6837 Body mass index (BMI) 37.0-37.9, adult: Secondary | ICD-10-CM | POA: Diagnosis not present

## 2015-11-23 DIAGNOSIS — Z299 Encounter for prophylactic measures, unspecified: Secondary | ICD-10-CM | POA: Diagnosis not present

## 2015-11-23 DIAGNOSIS — E1165 Type 2 diabetes mellitus with hyperglycemia: Secondary | ICD-10-CM | POA: Diagnosis not present

## 2015-11-23 DIAGNOSIS — Z5320 Procedure and treatment not carried out because of patient's decision for unspecified reasons: Secondary | ICD-10-CM | POA: Diagnosis not present

## 2015-11-23 DIAGNOSIS — M069 Rheumatoid arthritis, unspecified: Secondary | ICD-10-CM | POA: Diagnosis not present

## 2015-12-15 DIAGNOSIS — H04123 Dry eye syndrome of bilateral lacrimal glands: Secondary | ICD-10-CM | POA: Diagnosis not present

## 2015-12-15 DIAGNOSIS — Z961 Presence of intraocular lens: Secondary | ICD-10-CM | POA: Diagnosis not present

## 2015-12-15 DIAGNOSIS — Z7984 Long term (current) use of oral hypoglycemic drugs: Secondary | ICD-10-CM | POA: Diagnosis not present

## 2015-12-15 DIAGNOSIS — E119 Type 2 diabetes mellitus without complications: Secondary | ICD-10-CM | POA: Diagnosis not present

## 2015-12-29 DIAGNOSIS — I10 Essential (primary) hypertension: Secondary | ICD-10-CM | POA: Diagnosis not present

## 2015-12-29 DIAGNOSIS — E119 Type 2 diabetes mellitus without complications: Secondary | ICD-10-CM | POA: Diagnosis not present

## 2015-12-29 DIAGNOSIS — M069 Rheumatoid arthritis, unspecified: Secondary | ICD-10-CM | POA: Diagnosis not present

## 2015-12-29 DIAGNOSIS — E7800 Pure hypercholesterolemia, unspecified: Secondary | ICD-10-CM | POA: Diagnosis not present

## 2016-01-05 DIAGNOSIS — Z6837 Body mass index (BMI) 37.0-37.9, adult: Secondary | ICD-10-CM | POA: Diagnosis not present

## 2016-01-05 DIAGNOSIS — N390 Urinary tract infection, site not specified: Secondary | ICD-10-CM | POA: Diagnosis not present

## 2016-01-05 DIAGNOSIS — R350 Frequency of micturition: Secondary | ICD-10-CM | POA: Diagnosis not present

## 2016-01-05 DIAGNOSIS — Z299 Encounter for prophylactic measures, unspecified: Secondary | ICD-10-CM | POA: Diagnosis not present

## 2016-01-05 DIAGNOSIS — Z713 Dietary counseling and surveillance: Secondary | ICD-10-CM | POA: Diagnosis not present

## 2016-02-08 DIAGNOSIS — Z7982 Long term (current) use of aspirin: Secondary | ICD-10-CM | POA: Diagnosis not present

## 2016-02-08 DIAGNOSIS — Z79899 Other long term (current) drug therapy: Secondary | ICD-10-CM | POA: Diagnosis not present

## 2016-02-08 DIAGNOSIS — K219 Gastro-esophageal reflux disease without esophagitis: Secondary | ICD-10-CM | POA: Diagnosis not present

## 2016-02-08 DIAGNOSIS — M79675 Pain in left toe(s): Secondary | ICD-10-CM | POA: Diagnosis not present

## 2016-02-08 DIAGNOSIS — E119 Type 2 diabetes mellitus without complications: Secondary | ICD-10-CM | POA: Diagnosis not present

## 2016-02-08 DIAGNOSIS — M79674 Pain in right toe(s): Secondary | ICD-10-CM | POA: Diagnosis not present

## 2016-02-08 DIAGNOSIS — M79671 Pain in right foot: Secondary | ICD-10-CM | POA: Diagnosis not present

## 2016-02-08 DIAGNOSIS — M79672 Pain in left foot: Secondary | ICD-10-CM | POA: Diagnosis not present

## 2016-02-08 DIAGNOSIS — M069 Rheumatoid arthritis, unspecified: Secondary | ICD-10-CM | POA: Diagnosis not present

## 2016-02-14 DIAGNOSIS — R269 Unspecified abnormalities of gait and mobility: Secondary | ICD-10-CM | POA: Diagnosis not present

## 2016-02-14 DIAGNOSIS — I10 Essential (primary) hypertension: Secondary | ICD-10-CM | POA: Diagnosis not present

## 2016-02-14 DIAGNOSIS — M25562 Pain in left knee: Secondary | ICD-10-CM | POA: Diagnosis not present

## 2016-02-14 DIAGNOSIS — S8002XA Contusion of left knee, initial encounter: Secondary | ICD-10-CM | POA: Diagnosis not present

## 2016-02-14 DIAGNOSIS — Z299 Encounter for prophylactic measures, unspecified: Secondary | ICD-10-CM | POA: Diagnosis not present

## 2016-02-29 DIAGNOSIS — M069 Rheumatoid arthritis, unspecified: Secondary | ICD-10-CM | POA: Diagnosis not present

## 2016-02-29 DIAGNOSIS — Z789 Other specified health status: Secondary | ICD-10-CM | POA: Diagnosis not present

## 2016-02-29 DIAGNOSIS — I10 Essential (primary) hypertension: Secondary | ICD-10-CM | POA: Diagnosis not present

## 2016-02-29 DIAGNOSIS — Z299 Encounter for prophylactic measures, unspecified: Secondary | ICD-10-CM | POA: Diagnosis not present

## 2016-02-29 DIAGNOSIS — E1165 Type 2 diabetes mellitus with hyperglycemia: Secondary | ICD-10-CM | POA: Diagnosis not present

## 2016-02-29 DIAGNOSIS — K7460 Unspecified cirrhosis of liver: Secondary | ICD-10-CM | POA: Diagnosis not present

## 2016-04-12 DIAGNOSIS — I10 Essential (primary) hypertension: Secondary | ICD-10-CM | POA: Diagnosis not present

## 2016-04-12 DIAGNOSIS — E119 Type 2 diabetes mellitus without complications: Secondary | ICD-10-CM | POA: Diagnosis not present

## 2016-04-12 DIAGNOSIS — E7800 Pure hypercholesterolemia, unspecified: Secondary | ICD-10-CM | POA: Diagnosis not present

## 2016-04-12 DIAGNOSIS — M069 Rheumatoid arthritis, unspecified: Secondary | ICD-10-CM | POA: Diagnosis not present

## 2016-05-29 DIAGNOSIS — K7460 Unspecified cirrhosis of liver: Secondary | ICD-10-CM | POA: Diagnosis not present

## 2016-05-29 DIAGNOSIS — L4050 Arthropathic psoriasis, unspecified: Secondary | ICD-10-CM | POA: Diagnosis not present

## 2016-05-29 DIAGNOSIS — Z299 Encounter for prophylactic measures, unspecified: Secondary | ICD-10-CM | POA: Diagnosis not present

## 2016-05-29 DIAGNOSIS — E1165 Type 2 diabetes mellitus with hyperglycemia: Secondary | ICD-10-CM | POA: Diagnosis not present

## 2016-05-29 DIAGNOSIS — I6529 Occlusion and stenosis of unspecified carotid artery: Secondary | ICD-10-CM | POA: Diagnosis not present

## 2016-05-29 DIAGNOSIS — I10 Essential (primary) hypertension: Secondary | ICD-10-CM | POA: Diagnosis not present

## 2016-05-31 DIAGNOSIS — L4059 Other psoriatic arthropathy: Secondary | ICD-10-CM | POA: Diagnosis not present

## 2016-05-31 DIAGNOSIS — E559 Vitamin D deficiency, unspecified: Secondary | ICD-10-CM | POA: Diagnosis not present

## 2016-05-31 DIAGNOSIS — R110 Nausea: Secondary | ICD-10-CM | POA: Diagnosis not present

## 2016-05-31 DIAGNOSIS — L409 Psoriasis, unspecified: Secondary | ICD-10-CM | POA: Diagnosis not present

## 2016-06-05 DIAGNOSIS — I739 Peripheral vascular disease, unspecified: Secondary | ICD-10-CM | POA: Diagnosis not present

## 2016-06-05 DIAGNOSIS — Z299 Encounter for prophylactic measures, unspecified: Secondary | ICD-10-CM | POA: Diagnosis not present

## 2016-06-05 DIAGNOSIS — M069 Rheumatoid arthritis, unspecified: Secondary | ICD-10-CM | POA: Diagnosis not present

## 2016-06-05 DIAGNOSIS — I10 Essential (primary) hypertension: Secondary | ICD-10-CM | POA: Diagnosis not present

## 2016-06-05 DIAGNOSIS — K7460 Unspecified cirrhosis of liver: Secondary | ICD-10-CM | POA: Diagnosis not present

## 2016-06-05 DIAGNOSIS — E1165 Type 2 diabetes mellitus with hyperglycemia: Secondary | ICD-10-CM | POA: Diagnosis not present

## 2016-06-05 DIAGNOSIS — L4050 Arthropathic psoriasis, unspecified: Secondary | ICD-10-CM | POA: Diagnosis not present

## 2016-06-05 DIAGNOSIS — R350 Frequency of micturition: Secondary | ICD-10-CM | POA: Diagnosis not present

## 2016-07-19 DIAGNOSIS — E119 Type 2 diabetes mellitus without complications: Secondary | ICD-10-CM | POA: Diagnosis not present

## 2016-07-19 DIAGNOSIS — I10 Essential (primary) hypertension: Secondary | ICD-10-CM | POA: Diagnosis not present

## 2016-07-19 DIAGNOSIS — E7800 Pure hypercholesterolemia, unspecified: Secondary | ICD-10-CM | POA: Diagnosis not present

## 2016-07-19 DIAGNOSIS — M069 Rheumatoid arthritis, unspecified: Secondary | ICD-10-CM | POA: Diagnosis not present

## 2016-07-23 DIAGNOSIS — S81011A Laceration without foreign body, right knee, initial encounter: Secondary | ICD-10-CM | POA: Diagnosis not present

## 2016-07-23 DIAGNOSIS — T148XXA Other injury of unspecified body region, initial encounter: Secondary | ICD-10-CM | POA: Diagnosis not present

## 2016-07-23 DIAGNOSIS — M79604 Pain in right leg: Secondary | ICD-10-CM | POA: Diagnosis not present

## 2016-07-23 DIAGNOSIS — Z79899 Other long term (current) drug therapy: Secondary | ICD-10-CM | POA: Diagnosis not present

## 2016-07-23 DIAGNOSIS — Z23 Encounter for immunization: Secondary | ICD-10-CM | POA: Diagnosis not present

## 2016-07-23 DIAGNOSIS — S0181XA Laceration without foreign body of other part of head, initial encounter: Secondary | ICD-10-CM | POA: Diagnosis not present

## 2016-07-23 DIAGNOSIS — S199XXA Unspecified injury of neck, initial encounter: Secondary | ICD-10-CM | POA: Diagnosis not present

## 2016-07-23 DIAGNOSIS — M069 Rheumatoid arthritis, unspecified: Secondary | ICD-10-CM | POA: Diagnosis not present

## 2016-07-23 DIAGNOSIS — S0083XA Contusion of other part of head, initial encounter: Secondary | ICD-10-CM | POA: Diagnosis not present

## 2016-07-23 DIAGNOSIS — E119 Type 2 diabetes mellitus without complications: Secondary | ICD-10-CM | POA: Diagnosis not present

## 2016-07-23 DIAGNOSIS — K219 Gastro-esophageal reflux disease without esophagitis: Secondary | ICD-10-CM | POA: Diagnosis not present

## 2016-07-23 DIAGNOSIS — S0990XA Unspecified injury of head, initial encounter: Secondary | ICD-10-CM | POA: Diagnosis not present

## 2016-07-23 DIAGNOSIS — S0993XA Unspecified injury of face, initial encounter: Secondary | ICD-10-CM | POA: Diagnosis not present

## 2016-08-02 DIAGNOSIS — S0083XA Contusion of other part of head, initial encounter: Secondary | ICD-10-CM | POA: Diagnosis not present

## 2016-08-02 DIAGNOSIS — I10 Essential (primary) hypertension: Secondary | ICD-10-CM | POA: Diagnosis not present

## 2016-08-02 DIAGNOSIS — L4050 Arthropathic psoriasis, unspecified: Secondary | ICD-10-CM | POA: Diagnosis not present

## 2016-08-02 DIAGNOSIS — E7800 Pure hypercholesterolemia, unspecified: Secondary | ICD-10-CM | POA: Diagnosis not present

## 2016-08-02 DIAGNOSIS — M25561 Pain in right knee: Secondary | ICD-10-CM | POA: Diagnosis not present

## 2016-08-02 DIAGNOSIS — J45909 Unspecified asthma, uncomplicated: Secondary | ICD-10-CM | POA: Diagnosis not present

## 2016-08-02 DIAGNOSIS — I739 Peripheral vascular disease, unspecified: Secondary | ICD-10-CM | POA: Diagnosis not present

## 2016-08-02 DIAGNOSIS — Z299 Encounter for prophylactic measures, unspecified: Secondary | ICD-10-CM | POA: Diagnosis not present

## 2016-08-02 DIAGNOSIS — I6529 Occlusion and stenosis of unspecified carotid artery: Secondary | ICD-10-CM | POA: Diagnosis not present

## 2016-08-09 DIAGNOSIS — I10 Essential (primary) hypertension: Secondary | ICD-10-CM | POA: Diagnosis not present

## 2016-08-09 DIAGNOSIS — Z299 Encounter for prophylactic measures, unspecified: Secondary | ICD-10-CM | POA: Diagnosis not present

## 2016-08-09 DIAGNOSIS — M25561 Pain in right knee: Secondary | ICD-10-CM | POA: Diagnosis not present

## 2016-08-09 DIAGNOSIS — S0083XA Contusion of other part of head, initial encounter: Secondary | ICD-10-CM | POA: Diagnosis not present

## 2016-08-23 DIAGNOSIS — R5383 Other fatigue: Secondary | ICD-10-CM | POA: Diagnosis not present

## 2016-08-23 DIAGNOSIS — E7800 Pure hypercholesterolemia, unspecified: Secondary | ICD-10-CM | POA: Diagnosis not present

## 2016-08-23 DIAGNOSIS — Z299 Encounter for prophylactic measures, unspecified: Secondary | ICD-10-CM | POA: Diagnosis not present

## 2016-08-23 DIAGNOSIS — I10 Essential (primary) hypertension: Secondary | ICD-10-CM | POA: Diagnosis not present

## 2016-08-23 DIAGNOSIS — J45909 Unspecified asthma, uncomplicated: Secondary | ICD-10-CM | POA: Diagnosis not present

## 2016-08-23 DIAGNOSIS — J069 Acute upper respiratory infection, unspecified: Secondary | ICD-10-CM | POA: Diagnosis not present

## 2016-08-23 DIAGNOSIS — Z1389 Encounter for screening for other disorder: Secondary | ICD-10-CM | POA: Diagnosis not present

## 2016-08-23 DIAGNOSIS — I739 Peripheral vascular disease, unspecified: Secondary | ICD-10-CM | POA: Diagnosis not present

## 2016-08-23 DIAGNOSIS — Z7189 Other specified counseling: Secondary | ICD-10-CM | POA: Diagnosis not present

## 2016-08-23 DIAGNOSIS — Z1211 Encounter for screening for malignant neoplasm of colon: Secondary | ICD-10-CM | POA: Diagnosis not present

## 2016-08-23 DIAGNOSIS — E1165 Type 2 diabetes mellitus with hyperglycemia: Secondary | ICD-10-CM | POA: Diagnosis not present

## 2016-08-23 DIAGNOSIS — Z79899 Other long term (current) drug therapy: Secondary | ICD-10-CM | POA: Diagnosis not present

## 2016-08-23 DIAGNOSIS — Z0000 Encounter for general adult medical examination without abnormal findings: Secondary | ICD-10-CM | POA: Diagnosis not present

## 2016-09-18 DIAGNOSIS — I6529 Occlusion and stenosis of unspecified carotid artery: Secondary | ICD-10-CM | POA: Diagnosis not present

## 2016-09-18 DIAGNOSIS — K7460 Unspecified cirrhosis of liver: Secondary | ICD-10-CM | POA: Diagnosis not present

## 2016-09-18 DIAGNOSIS — E7800 Pure hypercholesterolemia, unspecified: Secondary | ICD-10-CM | POA: Diagnosis not present

## 2016-09-18 DIAGNOSIS — E1165 Type 2 diabetes mellitus with hyperglycemia: Secondary | ICD-10-CM | POA: Diagnosis not present

## 2016-09-18 DIAGNOSIS — Z299 Encounter for prophylactic measures, unspecified: Secondary | ICD-10-CM | POA: Diagnosis not present

## 2016-09-18 DIAGNOSIS — M069 Rheumatoid arthritis, unspecified: Secondary | ICD-10-CM | POA: Diagnosis not present

## 2016-09-18 DIAGNOSIS — I739 Peripheral vascular disease, unspecified: Secondary | ICD-10-CM | POA: Diagnosis not present

## 2016-09-18 DIAGNOSIS — L4050 Arthropathic psoriasis, unspecified: Secondary | ICD-10-CM | POA: Diagnosis not present

## 2016-09-18 DIAGNOSIS — I10 Essential (primary) hypertension: Secondary | ICD-10-CM | POA: Diagnosis not present

## 2016-09-18 DIAGNOSIS — Z6838 Body mass index (BMI) 38.0-38.9, adult: Secondary | ICD-10-CM | POA: Diagnosis not present

## 2016-10-17 DIAGNOSIS — I739 Peripheral vascular disease, unspecified: Secondary | ICD-10-CM | POA: Diagnosis not present

## 2016-10-17 DIAGNOSIS — E1165 Type 2 diabetes mellitus with hyperglycemia: Secondary | ICD-10-CM | POA: Diagnosis not present

## 2016-10-17 DIAGNOSIS — I10 Essential (primary) hypertension: Secondary | ICD-10-CM | POA: Diagnosis not present

## 2016-10-17 DIAGNOSIS — Z713 Dietary counseling and surveillance: Secondary | ICD-10-CM | POA: Diagnosis not present

## 2016-10-17 DIAGNOSIS — E7800 Pure hypercholesterolemia, unspecified: Secondary | ICD-10-CM | POA: Diagnosis not present

## 2016-10-17 DIAGNOSIS — Z299 Encounter for prophylactic measures, unspecified: Secondary | ICD-10-CM | POA: Diagnosis not present

## 2016-10-17 DIAGNOSIS — K7460 Unspecified cirrhosis of liver: Secondary | ICD-10-CM | POA: Diagnosis not present

## 2016-10-17 DIAGNOSIS — M069 Rheumatoid arthritis, unspecified: Secondary | ICD-10-CM | POA: Diagnosis not present

## 2016-11-29 DIAGNOSIS — L409 Psoriasis, unspecified: Secondary | ICD-10-CM | POA: Diagnosis not present

## 2016-11-29 DIAGNOSIS — E559 Vitamin D deficiency, unspecified: Secondary | ICD-10-CM | POA: Diagnosis not present

## 2016-11-29 DIAGNOSIS — R110 Nausea: Secondary | ICD-10-CM | POA: Diagnosis not present

## 2016-11-29 DIAGNOSIS — L4059 Other psoriatic arthropathy: Secondary | ICD-10-CM | POA: Diagnosis not present

## 2016-12-24 DIAGNOSIS — Z6838 Body mass index (BMI) 38.0-38.9, adult: Secondary | ICD-10-CM | POA: Diagnosis not present

## 2016-12-24 DIAGNOSIS — M069 Rheumatoid arthritis, unspecified: Secondary | ICD-10-CM | POA: Diagnosis not present

## 2016-12-24 DIAGNOSIS — K7460 Unspecified cirrhosis of liver: Secondary | ICD-10-CM | POA: Diagnosis not present

## 2016-12-24 DIAGNOSIS — Z299 Encounter for prophylactic measures, unspecified: Secondary | ICD-10-CM | POA: Diagnosis not present

## 2016-12-24 DIAGNOSIS — I10 Essential (primary) hypertension: Secondary | ICD-10-CM | POA: Diagnosis not present

## 2016-12-24 DIAGNOSIS — E1165 Type 2 diabetes mellitus with hyperglycemia: Secondary | ICD-10-CM | POA: Diagnosis not present

## 2016-12-25 DIAGNOSIS — H04123 Dry eye syndrome of bilateral lacrimal glands: Secondary | ICD-10-CM | POA: Diagnosis not present

## 2016-12-25 DIAGNOSIS — H35373 Puckering of macula, bilateral: Secondary | ICD-10-CM | POA: Diagnosis not present

## 2016-12-25 DIAGNOSIS — Z9849 Cataract extraction status, unspecified eye: Secondary | ICD-10-CM | POA: Diagnosis not present

## 2016-12-25 DIAGNOSIS — Z961 Presence of intraocular lens: Secondary | ICD-10-CM | POA: Diagnosis not present

## 2017-04-08 DIAGNOSIS — Z6838 Body mass index (BMI) 38.0-38.9, adult: Secondary | ICD-10-CM | POA: Diagnosis not present

## 2017-04-08 DIAGNOSIS — L4050 Arthropathic psoriasis, unspecified: Secondary | ICD-10-CM | POA: Diagnosis not present

## 2017-04-08 DIAGNOSIS — Z299 Encounter for prophylactic measures, unspecified: Secondary | ICD-10-CM | POA: Diagnosis not present

## 2017-04-08 DIAGNOSIS — M069 Rheumatoid arthritis, unspecified: Secondary | ICD-10-CM | POA: Diagnosis not present

## 2017-04-08 DIAGNOSIS — K7460 Unspecified cirrhosis of liver: Secondary | ICD-10-CM | POA: Diagnosis not present

## 2017-04-08 DIAGNOSIS — I739 Peripheral vascular disease, unspecified: Secondary | ICD-10-CM | POA: Diagnosis not present

## 2017-04-08 DIAGNOSIS — E1165 Type 2 diabetes mellitus with hyperglycemia: Secondary | ICD-10-CM | POA: Diagnosis not present

## 2017-04-24 DIAGNOSIS — E7800 Pure hypercholesterolemia, unspecified: Secondary | ICD-10-CM | POA: Diagnosis not present

## 2017-04-24 DIAGNOSIS — I10 Essential (primary) hypertension: Secondary | ICD-10-CM | POA: Diagnosis not present

## 2017-04-24 DIAGNOSIS — M069 Rheumatoid arthritis, unspecified: Secondary | ICD-10-CM | POA: Diagnosis not present

## 2017-04-24 DIAGNOSIS — E119 Type 2 diabetes mellitus without complications: Secondary | ICD-10-CM | POA: Diagnosis not present

## 2017-05-21 DIAGNOSIS — N390 Urinary tract infection, site not specified: Secondary | ICD-10-CM | POA: Diagnosis not present

## 2017-05-21 DIAGNOSIS — R350 Frequency of micturition: Secondary | ICD-10-CM | POA: Diagnosis not present

## 2017-05-21 DIAGNOSIS — Z299 Encounter for prophylactic measures, unspecified: Secondary | ICD-10-CM | POA: Diagnosis not present

## 2017-05-21 DIAGNOSIS — K219 Gastro-esophageal reflux disease without esophagitis: Secondary | ICD-10-CM | POA: Diagnosis not present

## 2017-05-21 DIAGNOSIS — I10 Essential (primary) hypertension: Secondary | ICD-10-CM | POA: Diagnosis not present

## 2017-05-21 DIAGNOSIS — Z6839 Body mass index (BMI) 39.0-39.9, adult: Secondary | ICD-10-CM | POA: Diagnosis not present

## 2017-05-21 DIAGNOSIS — M069 Rheumatoid arthritis, unspecified: Secondary | ICD-10-CM | POA: Diagnosis not present

## 2017-05-21 DIAGNOSIS — E1165 Type 2 diabetes mellitus with hyperglycemia: Secondary | ICD-10-CM | POA: Diagnosis not present

## 2017-05-30 DIAGNOSIS — L4059 Other psoriatic arthropathy: Secondary | ICD-10-CM | POA: Diagnosis not present

## 2017-05-30 DIAGNOSIS — L409 Psoriasis, unspecified: Secondary | ICD-10-CM | POA: Diagnosis not present

## 2017-05-30 DIAGNOSIS — E559 Vitamin D deficiency, unspecified: Secondary | ICD-10-CM | POA: Diagnosis not present

## 2017-05-30 DIAGNOSIS — R110 Nausea: Secondary | ICD-10-CM | POA: Diagnosis not present

## 2017-07-11 DIAGNOSIS — I739 Peripheral vascular disease, unspecified: Secondary | ICD-10-CM | POA: Diagnosis not present

## 2017-07-11 DIAGNOSIS — K7460 Unspecified cirrhosis of liver: Secondary | ICD-10-CM | POA: Diagnosis not present

## 2017-07-11 DIAGNOSIS — Z299 Encounter for prophylactic measures, unspecified: Secondary | ICD-10-CM | POA: Diagnosis not present

## 2017-07-11 DIAGNOSIS — M069 Rheumatoid arthritis, unspecified: Secondary | ICD-10-CM | POA: Diagnosis not present

## 2017-07-11 DIAGNOSIS — E1165 Type 2 diabetes mellitus with hyperglycemia: Secondary | ICD-10-CM | POA: Diagnosis not present

## 2017-07-11 DIAGNOSIS — L4050 Arthropathic psoriasis, unspecified: Secondary | ICD-10-CM | POA: Diagnosis not present

## 2017-07-11 DIAGNOSIS — I10 Essential (primary) hypertension: Secondary | ICD-10-CM | POA: Diagnosis not present

## 2017-07-11 DIAGNOSIS — Z6839 Body mass index (BMI) 39.0-39.9, adult: Secondary | ICD-10-CM | POA: Diagnosis not present

## 2017-08-06 DIAGNOSIS — E1151 Type 2 diabetes mellitus with diabetic peripheral angiopathy without gangrene: Secondary | ICD-10-CM | POA: Diagnosis not present

## 2017-08-06 DIAGNOSIS — M79676 Pain in unspecified toe(s): Secondary | ICD-10-CM | POA: Diagnosis not present

## 2017-08-06 DIAGNOSIS — L84 Corns and callosities: Secondary | ICD-10-CM | POA: Diagnosis not present

## 2017-08-06 DIAGNOSIS — B351 Tinea unguium: Secondary | ICD-10-CM | POA: Diagnosis not present

## 2017-08-29 DIAGNOSIS — Z1331 Encounter for screening for depression: Secondary | ICD-10-CM | POA: Diagnosis not present

## 2017-08-29 DIAGNOSIS — Z1339 Encounter for screening examination for other mental health and behavioral disorders: Secondary | ICD-10-CM | POA: Diagnosis not present

## 2017-08-29 DIAGNOSIS — M069 Rheumatoid arthritis, unspecified: Secondary | ICD-10-CM | POA: Diagnosis not present

## 2017-08-29 DIAGNOSIS — Z0000 Encounter for general adult medical examination without abnormal findings: Secondary | ICD-10-CM | POA: Diagnosis not present

## 2017-08-29 DIAGNOSIS — R5383 Other fatigue: Secondary | ICD-10-CM | POA: Diagnosis not present

## 2017-08-29 DIAGNOSIS — E7800 Pure hypercholesterolemia, unspecified: Secondary | ICD-10-CM | POA: Diagnosis not present

## 2017-08-29 DIAGNOSIS — Z789 Other specified health status: Secondary | ICD-10-CM | POA: Diagnosis not present

## 2017-08-29 DIAGNOSIS — I10 Essential (primary) hypertension: Secondary | ICD-10-CM | POA: Diagnosis not present

## 2017-08-29 DIAGNOSIS — Z7189 Other specified counseling: Secondary | ICD-10-CM | POA: Diagnosis not present

## 2017-08-29 DIAGNOSIS — Z1211 Encounter for screening for malignant neoplasm of colon: Secondary | ICD-10-CM | POA: Diagnosis not present

## 2017-08-29 DIAGNOSIS — E1165 Type 2 diabetes mellitus with hyperglycemia: Secondary | ICD-10-CM | POA: Diagnosis not present

## 2017-08-29 DIAGNOSIS — Z299 Encounter for prophylactic measures, unspecified: Secondary | ICD-10-CM | POA: Diagnosis not present

## 2017-08-29 DIAGNOSIS — Z79899 Other long term (current) drug therapy: Secondary | ICD-10-CM | POA: Diagnosis not present

## 2017-08-29 DIAGNOSIS — Z6839 Body mass index (BMI) 39.0-39.9, adult: Secondary | ICD-10-CM | POA: Diagnosis not present

## 2017-09-23 DIAGNOSIS — I739 Peripheral vascular disease, unspecified: Secondary | ICD-10-CM | POA: Diagnosis not present

## 2017-09-23 DIAGNOSIS — I10 Essential (primary) hypertension: Secondary | ICD-10-CM | POA: Diagnosis not present

## 2017-09-23 DIAGNOSIS — Z299 Encounter for prophylactic measures, unspecified: Secondary | ICD-10-CM | POA: Diagnosis not present

## 2017-09-23 DIAGNOSIS — Z6839 Body mass index (BMI) 39.0-39.9, adult: Secondary | ICD-10-CM | POA: Diagnosis not present

## 2017-09-23 DIAGNOSIS — E1165 Type 2 diabetes mellitus with hyperglycemia: Secondary | ICD-10-CM | POA: Diagnosis not present

## 2017-09-23 DIAGNOSIS — L97909 Non-pressure chronic ulcer of unspecified part of unspecified lower leg with unspecified severity: Secondary | ICD-10-CM | POA: Diagnosis not present

## 2017-09-23 DIAGNOSIS — M069 Rheumatoid arthritis, unspecified: Secondary | ICD-10-CM | POA: Diagnosis not present

## 2017-09-30 DIAGNOSIS — I739 Peripheral vascular disease, unspecified: Secondary | ICD-10-CM | POA: Diagnosis not present

## 2017-09-30 DIAGNOSIS — L0390 Cellulitis, unspecified: Secondary | ICD-10-CM | POA: Diagnosis not present

## 2017-09-30 DIAGNOSIS — Z299 Encounter for prophylactic measures, unspecified: Secondary | ICD-10-CM | POA: Diagnosis not present

## 2017-09-30 DIAGNOSIS — E1165 Type 2 diabetes mellitus with hyperglycemia: Secondary | ICD-10-CM | POA: Diagnosis not present

## 2017-09-30 DIAGNOSIS — M069 Rheumatoid arthritis, unspecified: Secondary | ICD-10-CM | POA: Diagnosis not present

## 2017-10-31 DIAGNOSIS — Z299 Encounter for prophylactic measures, unspecified: Secondary | ICD-10-CM | POA: Diagnosis not present

## 2017-10-31 DIAGNOSIS — Z6839 Body mass index (BMI) 39.0-39.9, adult: Secondary | ICD-10-CM | POA: Diagnosis not present

## 2017-10-31 DIAGNOSIS — M069 Rheumatoid arthritis, unspecified: Secondary | ICD-10-CM | POA: Diagnosis not present

## 2017-10-31 DIAGNOSIS — Z713 Dietary counseling and surveillance: Secondary | ICD-10-CM | POA: Diagnosis not present

## 2017-10-31 DIAGNOSIS — E1165 Type 2 diabetes mellitus with hyperglycemia: Secondary | ICD-10-CM | POA: Diagnosis not present

## 2017-11-25 DIAGNOSIS — I10 Essential (primary) hypertension: Secondary | ICD-10-CM | POA: Diagnosis not present

## 2017-11-25 DIAGNOSIS — K7460 Unspecified cirrhosis of liver: Secondary | ICD-10-CM | POA: Diagnosis not present

## 2017-11-25 DIAGNOSIS — L0390 Cellulitis, unspecified: Secondary | ICD-10-CM | POA: Diagnosis not present

## 2017-11-25 DIAGNOSIS — Z6839 Body mass index (BMI) 39.0-39.9, adult: Secondary | ICD-10-CM | POA: Diagnosis not present

## 2017-11-26 DIAGNOSIS — M069 Rheumatoid arthritis, unspecified: Secondary | ICD-10-CM | POA: Diagnosis not present

## 2017-11-26 DIAGNOSIS — E119 Type 2 diabetes mellitus without complications: Secondary | ICD-10-CM | POA: Diagnosis not present

## 2017-11-26 DIAGNOSIS — E7800 Pure hypercholesterolemia, unspecified: Secondary | ICD-10-CM | POA: Diagnosis not present

## 2017-11-26 DIAGNOSIS — I10 Essential (primary) hypertension: Secondary | ICD-10-CM | POA: Diagnosis not present

## 2018-01-06 DIAGNOSIS — L409 Psoriasis, unspecified: Secondary | ICD-10-CM | POA: Diagnosis not present

## 2018-01-06 DIAGNOSIS — E559 Vitamin D deficiency, unspecified: Secondary | ICD-10-CM | POA: Diagnosis not present

## 2018-01-06 DIAGNOSIS — L4059 Other psoriatic arthropathy: Secondary | ICD-10-CM | POA: Diagnosis not present

## 2018-01-06 DIAGNOSIS — R110 Nausea: Secondary | ICD-10-CM | POA: Diagnosis not present

## 2018-01-10 DIAGNOSIS — Z299 Encounter for prophylactic measures, unspecified: Secondary | ICD-10-CM | POA: Diagnosis not present

## 2018-01-10 DIAGNOSIS — Z6839 Body mass index (BMI) 39.0-39.9, adult: Secondary | ICD-10-CM | POA: Diagnosis not present

## 2018-01-10 DIAGNOSIS — K7460 Unspecified cirrhosis of liver: Secondary | ICD-10-CM | POA: Diagnosis not present

## 2018-01-10 DIAGNOSIS — L03115 Cellulitis of right lower limb: Secondary | ICD-10-CM | POA: Diagnosis not present

## 2018-01-10 DIAGNOSIS — D638 Anemia in other chronic diseases classified elsewhere: Secondary | ICD-10-CM | POA: Diagnosis not present

## 2018-01-10 DIAGNOSIS — M069 Rheumatoid arthritis, unspecified: Secondary | ICD-10-CM | POA: Diagnosis not present

## 2018-01-14 DIAGNOSIS — H35373 Puckering of macula, bilateral: Secondary | ICD-10-CM | POA: Diagnosis not present

## 2018-01-14 DIAGNOSIS — E119 Type 2 diabetes mellitus without complications: Secondary | ICD-10-CM | POA: Diagnosis not present

## 2018-01-14 DIAGNOSIS — Z7984 Long term (current) use of oral hypoglycemic drugs: Secondary | ICD-10-CM | POA: Diagnosis not present

## 2018-01-14 DIAGNOSIS — H04123 Dry eye syndrome of bilateral lacrimal glands: Secondary | ICD-10-CM | POA: Diagnosis not present

## 2018-02-04 DIAGNOSIS — E1151 Type 2 diabetes mellitus with diabetic peripheral angiopathy without gangrene: Secondary | ICD-10-CM | POA: Diagnosis not present

## 2018-02-04 DIAGNOSIS — B351 Tinea unguium: Secondary | ICD-10-CM | POA: Diagnosis not present

## 2018-02-04 DIAGNOSIS — M79676 Pain in unspecified toe(s): Secondary | ICD-10-CM | POA: Diagnosis not present

## 2018-02-04 DIAGNOSIS — L84 Corns and callosities: Secondary | ICD-10-CM | POA: Diagnosis not present

## 2018-02-05 DIAGNOSIS — M069 Rheumatoid arthritis, unspecified: Secondary | ICD-10-CM | POA: Diagnosis not present

## 2018-02-05 DIAGNOSIS — I739 Peripheral vascular disease, unspecified: Secondary | ICD-10-CM | POA: Diagnosis not present

## 2018-02-05 DIAGNOSIS — E1165 Type 2 diabetes mellitus with hyperglycemia: Secondary | ICD-10-CM | POA: Diagnosis not present

## 2018-02-05 DIAGNOSIS — Z6839 Body mass index (BMI) 39.0-39.9, adult: Secondary | ICD-10-CM | POA: Diagnosis not present

## 2018-02-05 DIAGNOSIS — I10 Essential (primary) hypertension: Secondary | ICD-10-CM | POA: Diagnosis not present

## 2018-02-05 DIAGNOSIS — Z299 Encounter for prophylactic measures, unspecified: Secondary | ICD-10-CM | POA: Diagnosis not present

## 2018-02-20 DIAGNOSIS — R32 Unspecified urinary incontinence: Secondary | ICD-10-CM | POA: Diagnosis not present

## 2018-02-20 DIAGNOSIS — N069 Isolated proteinuria with unspecified morphologic lesion: Secondary | ICD-10-CM | POA: Diagnosis not present

## 2018-02-20 DIAGNOSIS — E119 Type 2 diabetes mellitus without complications: Secondary | ICD-10-CM | POA: Diagnosis not present

## 2018-03-07 DIAGNOSIS — Z299 Encounter for prophylactic measures, unspecified: Secondary | ICD-10-CM | POA: Diagnosis not present

## 2018-03-07 DIAGNOSIS — I10 Essential (primary) hypertension: Secondary | ICD-10-CM | POA: Diagnosis not present

## 2018-03-07 DIAGNOSIS — K7460 Unspecified cirrhosis of liver: Secondary | ICD-10-CM | POA: Diagnosis not present

## 2018-03-07 DIAGNOSIS — M069 Rheumatoid arthritis, unspecified: Secondary | ICD-10-CM | POA: Diagnosis not present

## 2018-03-07 DIAGNOSIS — E1165 Type 2 diabetes mellitus with hyperglycemia: Secondary | ICD-10-CM | POA: Diagnosis not present

## 2018-03-07 DIAGNOSIS — Z6839 Body mass index (BMI) 39.0-39.9, adult: Secondary | ICD-10-CM | POA: Diagnosis not present

## 2018-03-13 DIAGNOSIS — M069 Rheumatoid arthritis, unspecified: Secondary | ICD-10-CM | POA: Diagnosis not present

## 2018-03-13 DIAGNOSIS — E119 Type 2 diabetes mellitus without complications: Secondary | ICD-10-CM | POA: Diagnosis not present

## 2018-03-13 DIAGNOSIS — I10 Essential (primary) hypertension: Secondary | ICD-10-CM | POA: Diagnosis not present

## 2018-03-13 DIAGNOSIS — E7800 Pure hypercholesterolemia, unspecified: Secondary | ICD-10-CM | POA: Diagnosis not present

## 2018-04-06 DIAGNOSIS — M069 Rheumatoid arthritis, unspecified: Secondary | ICD-10-CM | POA: Diagnosis not present

## 2018-04-11 DIAGNOSIS — E7800 Pure hypercholesterolemia, unspecified: Secondary | ICD-10-CM | POA: Diagnosis not present

## 2018-04-11 DIAGNOSIS — M069 Rheumatoid arthritis, unspecified: Secondary | ICD-10-CM | POA: Diagnosis not present

## 2018-04-11 DIAGNOSIS — E119 Type 2 diabetes mellitus without complications: Secondary | ICD-10-CM | POA: Diagnosis not present

## 2018-04-11 DIAGNOSIS — I10 Essential (primary) hypertension: Secondary | ICD-10-CM | POA: Diagnosis not present

## 2018-04-17 DIAGNOSIS — D638 Anemia in other chronic diseases classified elsewhere: Secondary | ICD-10-CM | POA: Diagnosis not present

## 2018-04-17 DIAGNOSIS — Z299 Encounter for prophylactic measures, unspecified: Secondary | ICD-10-CM | POA: Diagnosis not present

## 2018-04-17 DIAGNOSIS — L299 Pruritus, unspecified: Secondary | ICD-10-CM | POA: Diagnosis not present

## 2018-04-17 DIAGNOSIS — M069 Rheumatoid arthritis, unspecified: Secondary | ICD-10-CM | POA: Diagnosis not present

## 2018-04-17 DIAGNOSIS — I10 Essential (primary) hypertension: Secondary | ICD-10-CM | POA: Diagnosis not present

## 2018-04-17 DIAGNOSIS — Z6839 Body mass index (BMI) 39.0-39.9, adult: Secondary | ICD-10-CM | POA: Diagnosis not present

## 2018-04-17 DIAGNOSIS — K7460 Unspecified cirrhosis of liver: Secondary | ICD-10-CM | POA: Diagnosis not present

## 2018-04-29 DIAGNOSIS — Z6839 Body mass index (BMI) 39.0-39.9, adult: Secondary | ICD-10-CM | POA: Diagnosis not present

## 2018-04-29 DIAGNOSIS — Z789 Other specified health status: Secondary | ICD-10-CM | POA: Diagnosis not present

## 2018-04-29 DIAGNOSIS — I10 Essential (primary) hypertension: Secondary | ICD-10-CM | POA: Diagnosis not present

## 2018-04-29 DIAGNOSIS — Z299 Encounter for prophylactic measures, unspecified: Secondary | ICD-10-CM | POA: Diagnosis not present

## 2018-04-29 DIAGNOSIS — L02429 Furuncle of limb, unspecified: Secondary | ICD-10-CM | POA: Diagnosis not present

## 2018-04-29 DIAGNOSIS — M069 Rheumatoid arthritis, unspecified: Secondary | ICD-10-CM | POA: Diagnosis not present

## 2018-05-07 DIAGNOSIS — M069 Rheumatoid arthritis, unspecified: Secondary | ICD-10-CM | POA: Diagnosis not present

## 2018-05-07 DIAGNOSIS — E119 Type 2 diabetes mellitus without complications: Secondary | ICD-10-CM | POA: Diagnosis not present

## 2018-05-07 DIAGNOSIS — I10 Essential (primary) hypertension: Secondary | ICD-10-CM | POA: Diagnosis not present

## 2018-05-07 DIAGNOSIS — E7800 Pure hypercholesterolemia, unspecified: Secondary | ICD-10-CM | POA: Diagnosis not present

## 2018-05-16 DIAGNOSIS — I739 Peripheral vascular disease, unspecified: Secondary | ICD-10-CM | POA: Diagnosis not present

## 2018-05-16 DIAGNOSIS — M069 Rheumatoid arthritis, unspecified: Secondary | ICD-10-CM | POA: Diagnosis not present

## 2018-05-16 DIAGNOSIS — E1165 Type 2 diabetes mellitus with hyperglycemia: Secondary | ICD-10-CM | POA: Diagnosis not present

## 2018-05-16 DIAGNOSIS — Z6839 Body mass index (BMI) 39.0-39.9, adult: Secondary | ICD-10-CM | POA: Diagnosis not present

## 2018-05-16 DIAGNOSIS — Z299 Encounter for prophylactic measures, unspecified: Secondary | ICD-10-CM | POA: Diagnosis not present

## 2018-05-16 DIAGNOSIS — L299 Pruritus, unspecified: Secondary | ICD-10-CM | POA: Diagnosis not present

## 2018-05-16 DIAGNOSIS — I10 Essential (primary) hypertension: Secondary | ICD-10-CM | POA: Diagnosis not present

## 2018-06-09 DIAGNOSIS — E7800 Pure hypercholesterolemia, unspecified: Secondary | ICD-10-CM | POA: Diagnosis not present

## 2018-06-09 DIAGNOSIS — I10 Essential (primary) hypertension: Secondary | ICD-10-CM | POA: Diagnosis not present

## 2018-06-09 DIAGNOSIS — E119 Type 2 diabetes mellitus without complications: Secondary | ICD-10-CM | POA: Diagnosis not present

## 2018-06-09 DIAGNOSIS — M069 Rheumatoid arthritis, unspecified: Secondary | ICD-10-CM | POA: Diagnosis not present

## 2018-06-27 DIAGNOSIS — E7800 Pure hypercholesterolemia, unspecified: Secondary | ICD-10-CM | POA: Diagnosis not present

## 2018-06-27 DIAGNOSIS — E119 Type 2 diabetes mellitus without complications: Secondary | ICD-10-CM | POA: Diagnosis not present

## 2018-06-27 DIAGNOSIS — I10 Essential (primary) hypertension: Secondary | ICD-10-CM | POA: Diagnosis not present

## 2018-06-27 DIAGNOSIS — M069 Rheumatoid arthritis, unspecified: Secondary | ICD-10-CM | POA: Diagnosis not present

## 2018-06-30 DIAGNOSIS — L4050 Arthropathic psoriasis, unspecified: Secondary | ICD-10-CM | POA: Diagnosis not present

## 2018-06-30 DIAGNOSIS — Z6839 Body mass index (BMI) 39.0-39.9, adult: Secondary | ICD-10-CM | POA: Diagnosis not present

## 2018-06-30 DIAGNOSIS — Z79899 Other long term (current) drug therapy: Secondary | ICD-10-CM | POA: Diagnosis not present

## 2018-06-30 DIAGNOSIS — I10 Essential (primary) hypertension: Secondary | ICD-10-CM | POA: Diagnosis not present

## 2018-06-30 DIAGNOSIS — K7460 Unspecified cirrhosis of liver: Secondary | ICD-10-CM | POA: Diagnosis not present

## 2018-06-30 DIAGNOSIS — E1165 Type 2 diabetes mellitus with hyperglycemia: Secondary | ICD-10-CM | POA: Diagnosis not present

## 2018-06-30 DIAGNOSIS — I739 Peripheral vascular disease, unspecified: Secondary | ICD-10-CM | POA: Diagnosis not present

## 2018-06-30 DIAGNOSIS — Z299 Encounter for prophylactic measures, unspecified: Secondary | ICD-10-CM | POA: Diagnosis not present

## 2018-07-07 DIAGNOSIS — E559 Vitamin D deficiency, unspecified: Secondary | ICD-10-CM | POA: Diagnosis not present

## 2018-07-07 DIAGNOSIS — L409 Psoriasis, unspecified: Secondary | ICD-10-CM | POA: Diagnosis not present

## 2018-07-07 DIAGNOSIS — L4059 Other psoriatic arthropathy: Secondary | ICD-10-CM | POA: Diagnosis not present

## 2018-07-07 DIAGNOSIS — R110 Nausea: Secondary | ICD-10-CM | POA: Diagnosis not present

## 2018-07-25 DIAGNOSIS — E119 Type 2 diabetes mellitus without complications: Secondary | ICD-10-CM | POA: Diagnosis not present

## 2018-07-25 DIAGNOSIS — I10 Essential (primary) hypertension: Secondary | ICD-10-CM | POA: Diagnosis not present

## 2018-07-25 DIAGNOSIS — E7800 Pure hypercholesterolemia, unspecified: Secondary | ICD-10-CM | POA: Diagnosis not present

## 2018-07-25 DIAGNOSIS — M069 Rheumatoid arthritis, unspecified: Secondary | ICD-10-CM | POA: Diagnosis not present

## 2018-08-17 DIAGNOSIS — M069 Rheumatoid arthritis, unspecified: Secondary | ICD-10-CM | POA: Diagnosis not present

## 2018-08-20 DIAGNOSIS — E7800 Pure hypercholesterolemia, unspecified: Secondary | ICD-10-CM | POA: Diagnosis not present

## 2018-08-20 DIAGNOSIS — E119 Type 2 diabetes mellitus without complications: Secondary | ICD-10-CM | POA: Diagnosis not present

## 2018-08-20 DIAGNOSIS — M069 Rheumatoid arthritis, unspecified: Secondary | ICD-10-CM | POA: Diagnosis not present

## 2018-08-20 DIAGNOSIS — I10 Essential (primary) hypertension: Secondary | ICD-10-CM | POA: Diagnosis not present

## 2018-08-26 DIAGNOSIS — M069 Rheumatoid arthritis, unspecified: Secondary | ICD-10-CM | POA: Diagnosis not present

## 2018-08-26 DIAGNOSIS — I10 Essential (primary) hypertension: Secondary | ICD-10-CM | POA: Diagnosis not present

## 2018-08-26 DIAGNOSIS — Z299 Encounter for prophylactic measures, unspecified: Secondary | ICD-10-CM | POA: Diagnosis not present

## 2018-08-26 DIAGNOSIS — E1165 Type 2 diabetes mellitus with hyperglycemia: Secondary | ICD-10-CM | POA: Diagnosis not present

## 2018-08-26 DIAGNOSIS — K7460 Unspecified cirrhosis of liver: Secondary | ICD-10-CM | POA: Diagnosis not present

## 2018-08-26 DIAGNOSIS — Z6839 Body mass index (BMI) 39.0-39.9, adult: Secondary | ICD-10-CM | POA: Diagnosis not present

## 2018-09-05 DIAGNOSIS — Z1211 Encounter for screening for malignant neoplasm of colon: Secondary | ICD-10-CM | POA: Diagnosis not present

## 2018-09-05 DIAGNOSIS — M069 Rheumatoid arthritis, unspecified: Secondary | ICD-10-CM | POA: Diagnosis not present

## 2018-09-05 DIAGNOSIS — Z1339 Encounter for screening examination for other mental health and behavioral disorders: Secondary | ICD-10-CM | POA: Diagnosis not present

## 2018-09-05 DIAGNOSIS — Z1331 Encounter for screening for depression: Secondary | ICD-10-CM | POA: Diagnosis not present

## 2018-09-05 DIAGNOSIS — E7800 Pure hypercholesterolemia, unspecified: Secondary | ICD-10-CM | POA: Diagnosis not present

## 2018-09-05 DIAGNOSIS — L0390 Cellulitis, unspecified: Secondary | ICD-10-CM | POA: Diagnosis not present

## 2018-09-05 DIAGNOSIS — Z79899 Other long term (current) drug therapy: Secondary | ICD-10-CM | POA: Diagnosis not present

## 2018-09-05 DIAGNOSIS — Z6839 Body mass index (BMI) 39.0-39.9, adult: Secondary | ICD-10-CM | POA: Diagnosis not present

## 2018-09-05 DIAGNOSIS — I10 Essential (primary) hypertension: Secondary | ICD-10-CM | POA: Diagnosis not present

## 2018-09-05 DIAGNOSIS — R5383 Other fatigue: Secondary | ICD-10-CM | POA: Diagnosis not present

## 2018-09-05 DIAGNOSIS — Z7189 Other specified counseling: Secondary | ICD-10-CM | POA: Diagnosis not present

## 2018-09-05 DIAGNOSIS — Z0000 Encounter for general adult medical examination without abnormal findings: Secondary | ICD-10-CM | POA: Diagnosis not present

## 2018-09-05 DIAGNOSIS — Z299 Encounter for prophylactic measures, unspecified: Secondary | ICD-10-CM | POA: Diagnosis not present

## 2018-09-25 DIAGNOSIS — E7800 Pure hypercholesterolemia, unspecified: Secondary | ICD-10-CM | POA: Diagnosis not present

## 2018-09-25 DIAGNOSIS — I10 Essential (primary) hypertension: Secondary | ICD-10-CM | POA: Diagnosis not present

## 2018-09-25 DIAGNOSIS — M069 Rheumatoid arthritis, unspecified: Secondary | ICD-10-CM | POA: Diagnosis not present

## 2018-09-25 DIAGNOSIS — E119 Type 2 diabetes mellitus without complications: Secondary | ICD-10-CM | POA: Diagnosis not present

## 2018-09-29 DIAGNOSIS — I10 Essential (primary) hypertension: Secondary | ICD-10-CM | POA: Diagnosis not present

## 2018-09-29 DIAGNOSIS — K7460 Unspecified cirrhosis of liver: Secondary | ICD-10-CM | POA: Diagnosis not present

## 2018-09-29 DIAGNOSIS — M069 Rheumatoid arthritis, unspecified: Secondary | ICD-10-CM | POA: Diagnosis not present

## 2018-09-29 DIAGNOSIS — Z6838 Body mass index (BMI) 38.0-38.9, adult: Secondary | ICD-10-CM | POA: Diagnosis not present

## 2018-09-29 DIAGNOSIS — Z299 Encounter for prophylactic measures, unspecified: Secondary | ICD-10-CM | POA: Diagnosis not present

## 2018-09-29 DIAGNOSIS — Z79899 Other long term (current) drug therapy: Secondary | ICD-10-CM | POA: Diagnosis not present

## 2018-10-22 DIAGNOSIS — M069 Rheumatoid arthritis, unspecified: Secondary | ICD-10-CM | POA: Diagnosis not present

## 2018-10-30 DIAGNOSIS — J45909 Unspecified asthma, uncomplicated: Secondary | ICD-10-CM | POA: Diagnosis not present

## 2018-10-30 DIAGNOSIS — Z79899 Other long term (current) drug therapy: Secondary | ICD-10-CM | POA: Diagnosis not present

## 2018-10-30 DIAGNOSIS — K7460 Unspecified cirrhosis of liver: Secondary | ICD-10-CM | POA: Diagnosis not present

## 2018-10-30 DIAGNOSIS — I10 Essential (primary) hypertension: Secondary | ICD-10-CM | POA: Diagnosis not present

## 2018-10-30 DIAGNOSIS — Z6839 Body mass index (BMI) 39.0-39.9, adult: Secondary | ICD-10-CM | POA: Diagnosis not present

## 2018-10-30 DIAGNOSIS — Z299 Encounter for prophylactic measures, unspecified: Secondary | ICD-10-CM | POA: Diagnosis not present

## 2018-10-30 DIAGNOSIS — M069 Rheumatoid arthritis, unspecified: Secondary | ICD-10-CM | POA: Diagnosis not present

## 2018-11-17 DIAGNOSIS — M069 Rheumatoid arthritis, unspecified: Secondary | ICD-10-CM | POA: Diagnosis not present

## 2018-11-17 DIAGNOSIS — I10 Essential (primary) hypertension: Secondary | ICD-10-CM | POA: Diagnosis not present

## 2018-11-17 DIAGNOSIS — E119 Type 2 diabetes mellitus without complications: Secondary | ICD-10-CM | POA: Diagnosis not present

## 2018-11-17 DIAGNOSIS — E7800 Pure hypercholesterolemia, unspecified: Secondary | ICD-10-CM | POA: Diagnosis not present

## 2018-11-21 DIAGNOSIS — Z6838 Body mass index (BMI) 38.0-38.9, adult: Secondary | ICD-10-CM | POA: Diagnosis not present

## 2018-11-21 DIAGNOSIS — I10 Essential (primary) hypertension: Secondary | ICD-10-CM | POA: Diagnosis not present

## 2018-11-21 DIAGNOSIS — M069 Rheumatoid arthritis, unspecified: Secondary | ICD-10-CM | POA: Diagnosis not present

## 2018-11-21 DIAGNOSIS — Z299 Encounter for prophylactic measures, unspecified: Secondary | ICD-10-CM | POA: Diagnosis not present

## 2018-11-21 DIAGNOSIS — R32 Unspecified urinary incontinence: Secondary | ICD-10-CM | POA: Diagnosis not present

## 2018-11-21 DIAGNOSIS — L97909 Non-pressure chronic ulcer of unspecified part of unspecified lower leg with unspecified severity: Secondary | ICD-10-CM | POA: Diagnosis not present

## 2018-12-05 DIAGNOSIS — E7800 Pure hypercholesterolemia, unspecified: Secondary | ICD-10-CM | POA: Diagnosis not present

## 2018-12-05 DIAGNOSIS — I10 Essential (primary) hypertension: Secondary | ICD-10-CM | POA: Diagnosis not present

## 2018-12-05 DIAGNOSIS — M069 Rheumatoid arthritis, unspecified: Secondary | ICD-10-CM | POA: Diagnosis not present

## 2018-12-05 DIAGNOSIS — E119 Type 2 diabetes mellitus without complications: Secondary | ICD-10-CM | POA: Diagnosis not present

## 2018-12-09 DIAGNOSIS — E1165 Type 2 diabetes mellitus with hyperglycemia: Secondary | ICD-10-CM | POA: Diagnosis not present

## 2018-12-09 DIAGNOSIS — Z299 Encounter for prophylactic measures, unspecified: Secondary | ICD-10-CM | POA: Diagnosis not present

## 2018-12-09 DIAGNOSIS — I10 Essential (primary) hypertension: Secondary | ICD-10-CM | POA: Diagnosis not present

## 2018-12-09 DIAGNOSIS — Z6838 Body mass index (BMI) 38.0-38.9, adult: Secondary | ICD-10-CM | POA: Diagnosis not present

## 2018-12-09 DIAGNOSIS — D692 Other nonthrombocytopenic purpura: Secondary | ICD-10-CM | POA: Diagnosis not present

## 2018-12-29 DIAGNOSIS — K7460 Unspecified cirrhosis of liver: Secondary | ICD-10-CM | POA: Diagnosis not present

## 2018-12-29 DIAGNOSIS — I10 Essential (primary) hypertension: Secondary | ICD-10-CM | POA: Diagnosis not present

## 2018-12-29 DIAGNOSIS — Z6838 Body mass index (BMI) 38.0-38.9, adult: Secondary | ICD-10-CM | POA: Diagnosis not present

## 2018-12-29 DIAGNOSIS — M069 Rheumatoid arthritis, unspecified: Secondary | ICD-10-CM | POA: Diagnosis not present

## 2018-12-29 DIAGNOSIS — E1165 Type 2 diabetes mellitus with hyperglycemia: Secondary | ICD-10-CM | POA: Diagnosis not present

## 2018-12-29 DIAGNOSIS — Z299 Encounter for prophylactic measures, unspecified: Secondary | ICD-10-CM | POA: Diagnosis not present

## 2018-12-29 DIAGNOSIS — D692 Other nonthrombocytopenic purpura: Secondary | ICD-10-CM | POA: Diagnosis not present

## 2019-01-07 DIAGNOSIS — L409 Psoriasis, unspecified: Secondary | ICD-10-CM | POA: Diagnosis not present

## 2019-01-07 DIAGNOSIS — L03115 Cellulitis of right lower limb: Secondary | ICD-10-CM | POA: Diagnosis not present

## 2019-01-07 DIAGNOSIS — L4059 Other psoriatic arthropathy: Secondary | ICD-10-CM | POA: Diagnosis not present

## 2019-01-07 DIAGNOSIS — R110 Nausea: Secondary | ICD-10-CM | POA: Diagnosis not present

## 2019-01-07 DIAGNOSIS — E559 Vitamin D deficiency, unspecified: Secondary | ICD-10-CM | POA: Diagnosis not present

## 2019-01-14 DIAGNOSIS — E119 Type 2 diabetes mellitus without complications: Secondary | ICD-10-CM | POA: Diagnosis not present

## 2019-02-15 DIAGNOSIS — E119 Type 2 diabetes mellitus without complications: Secondary | ICD-10-CM | POA: Diagnosis not present

## 2019-02-26 DIAGNOSIS — Z6838 Body mass index (BMI) 38.0-38.9, adult: Secondary | ICD-10-CM | POA: Diagnosis not present

## 2019-02-26 DIAGNOSIS — L97909 Non-pressure chronic ulcer of unspecified part of unspecified lower leg with unspecified severity: Secondary | ICD-10-CM | POA: Diagnosis not present

## 2019-02-26 DIAGNOSIS — E1165 Type 2 diabetes mellitus with hyperglycemia: Secondary | ICD-10-CM | POA: Diagnosis not present

## 2019-02-26 DIAGNOSIS — I10 Essential (primary) hypertension: Secondary | ICD-10-CM | POA: Diagnosis not present

## 2019-02-26 DIAGNOSIS — M069 Rheumatoid arthritis, unspecified: Secondary | ICD-10-CM | POA: Diagnosis not present

## 2019-02-26 DIAGNOSIS — Z299 Encounter for prophylactic measures, unspecified: Secondary | ICD-10-CM | POA: Diagnosis not present

## 2019-02-26 DIAGNOSIS — K7460 Unspecified cirrhosis of liver: Secondary | ICD-10-CM | POA: Diagnosis not present

## 2019-03-03 DIAGNOSIS — H6123 Impacted cerumen, bilateral: Secondary | ICD-10-CM | POA: Diagnosis not present

## 2019-03-03 DIAGNOSIS — I10 Essential (primary) hypertension: Secondary | ICD-10-CM | POA: Diagnosis not present

## 2019-03-03 DIAGNOSIS — Z299 Encounter for prophylactic measures, unspecified: Secondary | ICD-10-CM | POA: Diagnosis not present

## 2019-03-03 DIAGNOSIS — Z6838 Body mass index (BMI) 38.0-38.9, adult: Secondary | ICD-10-CM | POA: Diagnosis not present

## 2019-03-18 DIAGNOSIS — L97909 Non-pressure chronic ulcer of unspecified part of unspecified lower leg with unspecified severity: Secondary | ICD-10-CM | POA: Diagnosis not present

## 2019-03-18 DIAGNOSIS — E1165 Type 2 diabetes mellitus with hyperglycemia: Secondary | ICD-10-CM | POA: Diagnosis not present

## 2019-03-18 DIAGNOSIS — I739 Peripheral vascular disease, unspecified: Secondary | ICD-10-CM | POA: Diagnosis not present

## 2019-03-18 DIAGNOSIS — I10 Essential (primary) hypertension: Secondary | ICD-10-CM | POA: Diagnosis not present

## 2019-03-18 DIAGNOSIS — Z299 Encounter for prophylactic measures, unspecified: Secondary | ICD-10-CM | POA: Diagnosis not present

## 2019-03-18 DIAGNOSIS — Z6839 Body mass index (BMI) 39.0-39.9, adult: Secondary | ICD-10-CM | POA: Diagnosis not present

## 2019-03-27 DIAGNOSIS — I10 Essential (primary) hypertension: Secondary | ICD-10-CM | POA: Diagnosis not present

## 2019-03-27 DIAGNOSIS — M069 Rheumatoid arthritis, unspecified: Secondary | ICD-10-CM | POA: Diagnosis not present

## 2019-03-27 DIAGNOSIS — I739 Peripheral vascular disease, unspecified: Secondary | ICD-10-CM | POA: Diagnosis not present

## 2019-03-27 DIAGNOSIS — Z6838 Body mass index (BMI) 38.0-38.9, adult: Secondary | ICD-10-CM | POA: Diagnosis not present

## 2019-03-27 DIAGNOSIS — Z299 Encounter for prophylactic measures, unspecified: Secondary | ICD-10-CM | POA: Diagnosis not present

## 2019-03-27 DIAGNOSIS — E1165 Type 2 diabetes mellitus with hyperglycemia: Secondary | ICD-10-CM | POA: Diagnosis not present

## 2019-03-31 DIAGNOSIS — H5203 Hypermetropia, bilateral: Secondary | ICD-10-CM | POA: Diagnosis not present

## 2019-03-31 DIAGNOSIS — H04123 Dry eye syndrome of bilateral lacrimal glands: Secondary | ICD-10-CM | POA: Diagnosis not present

## 2019-03-31 DIAGNOSIS — H35373 Puckering of macula, bilateral: Secondary | ICD-10-CM | POA: Diagnosis not present

## 2019-03-31 DIAGNOSIS — H524 Presbyopia: Secondary | ICD-10-CM | POA: Diagnosis not present

## 2019-03-31 DIAGNOSIS — H52223 Regular astigmatism, bilateral: Secondary | ICD-10-CM | POA: Diagnosis not present

## 2019-03-31 DIAGNOSIS — Z961 Presence of intraocular lens: Secondary | ICD-10-CM | POA: Diagnosis not present

## 2019-04-02 DIAGNOSIS — E1165 Type 2 diabetes mellitus with hyperglycemia: Secondary | ICD-10-CM | POA: Diagnosis not present

## 2019-04-23 DIAGNOSIS — Z6838 Body mass index (BMI) 38.0-38.9, adult: Secondary | ICD-10-CM | POA: Diagnosis not present

## 2019-04-23 DIAGNOSIS — M2550 Pain in unspecified joint: Secondary | ICD-10-CM | POA: Diagnosis not present

## 2019-04-23 DIAGNOSIS — Z299 Encounter for prophylactic measures, unspecified: Secondary | ICD-10-CM | POA: Diagnosis not present

## 2019-04-23 DIAGNOSIS — E1165 Type 2 diabetes mellitus with hyperglycemia: Secondary | ICD-10-CM | POA: Diagnosis not present

## 2019-04-23 DIAGNOSIS — I10 Essential (primary) hypertension: Secondary | ICD-10-CM | POA: Diagnosis not present

## 2019-04-23 DIAGNOSIS — M069 Rheumatoid arthritis, unspecified: Secondary | ICD-10-CM | POA: Diagnosis not present

## 2019-04-23 DIAGNOSIS — K6289 Other specified diseases of anus and rectum: Secondary | ICD-10-CM | POA: Diagnosis not present

## 2019-04-27 DIAGNOSIS — R5381 Other malaise: Secondary | ICD-10-CM | POA: Diagnosis not present

## 2019-04-27 DIAGNOSIS — W19XXXA Unspecified fall, initial encounter: Secondary | ICD-10-CM | POA: Diagnosis not present

## 2019-04-27 DIAGNOSIS — R69 Illness, unspecified: Secondary | ICD-10-CM | POA: Diagnosis not present

## 2019-05-20 DIAGNOSIS — E119 Type 2 diabetes mellitus without complications: Secondary | ICD-10-CM | POA: Diagnosis not present

## 2019-05-20 DIAGNOSIS — M069 Rheumatoid arthritis, unspecified: Secondary | ICD-10-CM | POA: Diagnosis not present

## 2019-05-20 DIAGNOSIS — I10 Essential (primary) hypertension: Secondary | ICD-10-CM | POA: Diagnosis not present

## 2019-05-20 DIAGNOSIS — E7800 Pure hypercholesterolemia, unspecified: Secondary | ICD-10-CM | POA: Diagnosis not present

## 2019-05-25 DIAGNOSIS — M069 Rheumatoid arthritis, unspecified: Secondary | ICD-10-CM | POA: Diagnosis not present

## 2019-05-25 DIAGNOSIS — Z299 Encounter for prophylactic measures, unspecified: Secondary | ICD-10-CM | POA: Diagnosis not present

## 2019-05-25 DIAGNOSIS — I739 Peripheral vascular disease, unspecified: Secondary | ICD-10-CM | POA: Diagnosis not present

## 2019-05-25 DIAGNOSIS — M1710 Unilateral primary osteoarthritis, unspecified knee: Secondary | ICD-10-CM | POA: Diagnosis not present

## 2019-05-25 DIAGNOSIS — E1165 Type 2 diabetes mellitus with hyperglycemia: Secondary | ICD-10-CM | POA: Diagnosis not present

## 2019-05-25 DIAGNOSIS — Z6838 Body mass index (BMI) 38.0-38.9, adult: Secondary | ICD-10-CM | POA: Diagnosis not present

## 2019-05-25 DIAGNOSIS — I10 Essential (primary) hypertension: Secondary | ICD-10-CM | POA: Diagnosis not present

## 2019-06-04 DIAGNOSIS — E119 Type 2 diabetes mellitus without complications: Secondary | ICD-10-CM | POA: Diagnosis not present

## 2019-06-08 DIAGNOSIS — R5383 Other fatigue: Secondary | ICD-10-CM | POA: Diagnosis not present

## 2019-06-08 DIAGNOSIS — L03115 Cellulitis of right lower limb: Secondary | ICD-10-CM | POA: Diagnosis not present

## 2019-06-08 DIAGNOSIS — Z299 Encounter for prophylactic measures, unspecified: Secondary | ICD-10-CM | POA: Diagnosis not present

## 2019-06-08 DIAGNOSIS — M25561 Pain in right knee: Secondary | ICD-10-CM | POA: Diagnosis not present

## 2019-06-08 DIAGNOSIS — M069 Rheumatoid arthritis, unspecified: Secondary | ICD-10-CM | POA: Diagnosis not present

## 2019-06-15 DIAGNOSIS — R110 Nausea: Secondary | ICD-10-CM | POA: Diagnosis not present

## 2019-06-15 DIAGNOSIS — L03115 Cellulitis of right lower limb: Secondary | ICD-10-CM | POA: Diagnosis not present

## 2019-06-15 DIAGNOSIS — L4059 Other psoriatic arthropathy: Secondary | ICD-10-CM | POA: Diagnosis not present

## 2019-06-15 DIAGNOSIS — L409 Psoriasis, unspecified: Secondary | ICD-10-CM | POA: Diagnosis not present

## 2019-06-15 DIAGNOSIS — E559 Vitamin D deficiency, unspecified: Secondary | ICD-10-CM | POA: Diagnosis not present

## 2019-06-23 DIAGNOSIS — L4050 Arthropathic psoriasis, unspecified: Secondary | ICD-10-CM | POA: Diagnosis not present

## 2019-06-23 DIAGNOSIS — E1165 Type 2 diabetes mellitus with hyperglycemia: Secondary | ICD-10-CM | POA: Diagnosis not present

## 2019-06-23 DIAGNOSIS — M069 Rheumatoid arthritis, unspecified: Secondary | ICD-10-CM | POA: Diagnosis not present

## 2019-06-23 DIAGNOSIS — I10 Essential (primary) hypertension: Secondary | ICD-10-CM | POA: Diagnosis not present

## 2019-06-23 DIAGNOSIS — I739 Peripheral vascular disease, unspecified: Secondary | ICD-10-CM | POA: Diagnosis not present

## 2019-06-23 DIAGNOSIS — Z299 Encounter for prophylactic measures, unspecified: Secondary | ICD-10-CM | POA: Diagnosis not present

## 2019-06-30 DIAGNOSIS — L84 Corns and callosities: Secondary | ICD-10-CM | POA: Diagnosis not present

## 2019-06-30 DIAGNOSIS — M79676 Pain in unspecified toe(s): Secondary | ICD-10-CM | POA: Diagnosis not present

## 2019-06-30 DIAGNOSIS — B351 Tinea unguium: Secondary | ICD-10-CM | POA: Diagnosis not present

## 2019-06-30 DIAGNOSIS — E1151 Type 2 diabetes mellitus with diabetic peripheral angiopathy without gangrene: Secondary | ICD-10-CM | POA: Diagnosis not present

## 2019-07-19 DIAGNOSIS — E119 Type 2 diabetes mellitus without complications: Secondary | ICD-10-CM | POA: Diagnosis not present

## 2019-07-19 DIAGNOSIS — M069 Rheumatoid arthritis, unspecified: Secondary | ICD-10-CM | POA: Diagnosis not present

## 2019-07-19 DIAGNOSIS — E7800 Pure hypercholesterolemia, unspecified: Secondary | ICD-10-CM | POA: Diagnosis not present

## 2019-07-19 DIAGNOSIS — I10 Essential (primary) hypertension: Secondary | ICD-10-CM | POA: Diagnosis not present

## 2019-09-11 DIAGNOSIS — Z7189 Other specified counseling: Secondary | ICD-10-CM | POA: Diagnosis not present

## 2019-09-11 DIAGNOSIS — R5383 Other fatigue: Secondary | ICD-10-CM | POA: Diagnosis not present

## 2019-09-11 DIAGNOSIS — I10 Essential (primary) hypertension: Secondary | ICD-10-CM | POA: Diagnosis not present

## 2019-09-11 DIAGNOSIS — Z1331 Encounter for screening for depression: Secondary | ICD-10-CM | POA: Diagnosis not present

## 2019-09-11 DIAGNOSIS — Z299 Encounter for prophylactic measures, unspecified: Secondary | ICD-10-CM | POA: Diagnosis not present

## 2019-09-11 DIAGNOSIS — M069 Rheumatoid arthritis, unspecified: Secondary | ICD-10-CM | POA: Diagnosis not present

## 2019-09-11 DIAGNOSIS — Z1211 Encounter for screening for malignant neoplasm of colon: Secondary | ICD-10-CM | POA: Diagnosis not present

## 2019-09-11 DIAGNOSIS — Z0000 Encounter for general adult medical examination without abnormal findings: Secondary | ICD-10-CM | POA: Diagnosis not present

## 2019-09-11 DIAGNOSIS — Z79899 Other long term (current) drug therapy: Secondary | ICD-10-CM | POA: Diagnosis not present

## 2019-09-11 DIAGNOSIS — E7800 Pure hypercholesterolemia, unspecified: Secondary | ICD-10-CM | POA: Diagnosis not present

## 2019-09-11 DIAGNOSIS — Z1339 Encounter for screening examination for other mental health and behavioral disorders: Secondary | ICD-10-CM | POA: Diagnosis not present

## 2019-09-11 DIAGNOSIS — Z6838 Body mass index (BMI) 38.0-38.9, adult: Secondary | ICD-10-CM | POA: Diagnosis not present

## 2019-09-23 DIAGNOSIS — E119 Type 2 diabetes mellitus without complications: Secondary | ICD-10-CM | POA: Diagnosis not present

## 2019-09-28 DIAGNOSIS — Z299 Encounter for prophylactic measures, unspecified: Secondary | ICD-10-CM | POA: Diagnosis not present

## 2019-09-28 DIAGNOSIS — K7460 Unspecified cirrhosis of liver: Secondary | ICD-10-CM | POA: Diagnosis not present

## 2019-09-28 DIAGNOSIS — L97909 Non-pressure chronic ulcer of unspecified part of unspecified lower leg with unspecified severity: Secondary | ICD-10-CM | POA: Diagnosis not present

## 2019-09-28 DIAGNOSIS — M069 Rheumatoid arthritis, unspecified: Secondary | ICD-10-CM | POA: Diagnosis not present

## 2019-09-28 DIAGNOSIS — I10 Essential (primary) hypertension: Secondary | ICD-10-CM | POA: Diagnosis not present

## 2019-10-05 DIAGNOSIS — R079 Chest pain, unspecified: Secondary | ICD-10-CM | POA: Diagnosis not present

## 2019-10-05 DIAGNOSIS — R0789 Other chest pain: Secondary | ICD-10-CM | POA: Diagnosis not present

## 2019-10-27 DIAGNOSIS — L03115 Cellulitis of right lower limb: Secondary | ICD-10-CM | POA: Diagnosis not present

## 2019-10-27 DIAGNOSIS — E559 Vitamin D deficiency, unspecified: Secondary | ICD-10-CM | POA: Diagnosis not present

## 2019-10-27 DIAGNOSIS — L409 Psoriasis, unspecified: Secondary | ICD-10-CM | POA: Diagnosis not present

## 2019-10-27 DIAGNOSIS — R110 Nausea: Secondary | ICD-10-CM | POA: Diagnosis not present

## 2019-10-27 DIAGNOSIS — L4059 Other psoriatic arthropathy: Secondary | ICD-10-CM | POA: Diagnosis not present

## 2019-11-09 DIAGNOSIS — I10 Essential (primary) hypertension: Secondary | ICD-10-CM | POA: Diagnosis not present

## 2019-11-09 DIAGNOSIS — I739 Peripheral vascular disease, unspecified: Secondary | ICD-10-CM | POA: Diagnosis not present

## 2019-11-09 DIAGNOSIS — Z299 Encounter for prophylactic measures, unspecified: Secondary | ICD-10-CM | POA: Diagnosis not present

## 2019-11-09 DIAGNOSIS — K7460 Unspecified cirrhosis of liver: Secondary | ICD-10-CM | POA: Diagnosis not present

## 2019-11-09 DIAGNOSIS — E1165 Type 2 diabetes mellitus with hyperglycemia: Secondary | ICD-10-CM | POA: Diagnosis not present

## 2019-11-09 DIAGNOSIS — M069 Rheumatoid arthritis, unspecified: Secondary | ICD-10-CM | POA: Diagnosis not present

## 2019-11-22 DIAGNOSIS — M069 Rheumatoid arthritis, unspecified: Secondary | ICD-10-CM | POA: Diagnosis not present

## 2019-12-24 DIAGNOSIS — S81801A Unspecified open wound, right lower leg, initial encounter: Secondary | ICD-10-CM | POA: Diagnosis not present

## 2019-12-24 DIAGNOSIS — Z880 Allergy status to penicillin: Secondary | ICD-10-CM | POA: Diagnosis not present

## 2019-12-24 DIAGNOSIS — K439 Ventral hernia without obstruction or gangrene: Secondary | ICD-10-CM | POA: Diagnosis not present

## 2019-12-24 DIAGNOSIS — L97519 Non-pressure chronic ulcer of other part of right foot with unspecified severity: Secondary | ICD-10-CM | POA: Diagnosis not present

## 2019-12-24 DIAGNOSIS — S81802A Unspecified open wound, left lower leg, initial encounter: Secondary | ICD-10-CM | POA: Diagnosis not present

## 2019-12-24 DIAGNOSIS — K8020 Calculus of gallbladder without cholecystitis without obstruction: Secondary | ICD-10-CM | POA: Diagnosis not present

## 2019-12-24 DIAGNOSIS — Z886 Allergy status to analgesic agent status: Secondary | ICD-10-CM | POA: Diagnosis not present

## 2019-12-24 DIAGNOSIS — S72301D Unspecified fracture of shaft of right femur, subsequent encounter for closed fracture with routine healing: Secondary | ICD-10-CM | POA: Diagnosis not present

## 2019-12-24 DIAGNOSIS — R0902 Hypoxemia: Secondary | ICD-10-CM | POA: Diagnosis not present

## 2019-12-24 DIAGNOSIS — M25551 Pain in right hip: Secondary | ICD-10-CM | POA: Diagnosis not present

## 2019-12-24 DIAGNOSIS — L0390 Cellulitis, unspecified: Secondary | ICD-10-CM | POA: Diagnosis not present

## 2019-12-24 DIAGNOSIS — L97929 Non-pressure chronic ulcer of unspecified part of left lower leg with unspecified severity: Secondary | ICD-10-CM | POA: Diagnosis not present

## 2019-12-24 DIAGNOSIS — W19XXXA Unspecified fall, initial encounter: Secondary | ICD-10-CM | POA: Diagnosis not present

## 2019-12-24 DIAGNOSIS — Z20822 Contact with and (suspected) exposure to covid-19: Secondary | ICD-10-CM | POA: Diagnosis not present

## 2019-12-24 DIAGNOSIS — L97811 Non-pressure chronic ulcer of other part of right lower leg limited to breakdown of skin: Secondary | ICD-10-CM | POA: Diagnosis not present

## 2019-12-24 DIAGNOSIS — I700 Atherosclerosis of aorta: Secondary | ICD-10-CM | POA: Diagnosis not present

## 2019-12-24 DIAGNOSIS — S72001A Fracture of unspecified part of neck of right femur, initial encounter for closed fracture: Secondary | ICD-10-CM | POA: Diagnosis not present

## 2019-12-24 DIAGNOSIS — I959 Hypotension, unspecified: Secondary | ICD-10-CM | POA: Diagnosis not present

## 2019-12-24 DIAGNOSIS — R52 Pain, unspecified: Secondary | ICD-10-CM | POA: Diagnosis not present

## 2019-12-24 DIAGNOSIS — M069 Rheumatoid arthritis, unspecified: Secondary | ICD-10-CM | POA: Diagnosis not present

## 2019-12-24 DIAGNOSIS — L89152 Pressure ulcer of sacral region, stage 2: Secondary | ICD-10-CM | POA: Diagnosis not present

## 2019-12-24 DIAGNOSIS — R590 Localized enlarged lymph nodes: Secondary | ICD-10-CM | POA: Diagnosis not present

## 2019-12-24 DIAGNOSIS — Z882 Allergy status to sulfonamides status: Secondary | ICD-10-CM | POA: Diagnosis not present

## 2019-12-24 DIAGNOSIS — S72041A Displaced fracture of base of neck of right femur, initial encounter for closed fracture: Secondary | ICD-10-CM | POA: Diagnosis not present

## 2019-12-24 DIAGNOSIS — I83018 Varicose veins of right lower extremity with ulcer other part of lower leg: Secondary | ICD-10-CM | POA: Diagnosis not present

## 2019-12-24 DIAGNOSIS — I872 Venous insufficiency (chronic) (peripheral): Secondary | ICD-10-CM | POA: Diagnosis not present

## 2019-12-25 DIAGNOSIS — Y999 Unspecified external cause status: Secondary | ICD-10-CM | POA: Diagnosis not present

## 2019-12-25 DIAGNOSIS — L97929 Non-pressure chronic ulcer of unspecified part of left lower leg with unspecified severity: Secondary | ICD-10-CM | POA: Diagnosis not present

## 2019-12-25 DIAGNOSIS — R52 Pain, unspecified: Secondary | ICD-10-CM | POA: Diagnosis not present

## 2019-12-25 DIAGNOSIS — I959 Hypotension, unspecified: Secondary | ICD-10-CM | POA: Diagnosis not present

## 2019-12-25 DIAGNOSIS — Z880 Allergy status to penicillin: Secondary | ICD-10-CM | POA: Diagnosis not present

## 2019-12-25 DIAGNOSIS — L97811 Non-pressure chronic ulcer of other part of right lower leg limited to breakdown of skin: Secondary | ICD-10-CM | POA: Diagnosis not present

## 2019-12-25 DIAGNOSIS — I83892 Varicose veins of left lower extremities with other complications: Secondary | ICD-10-CM | POA: Diagnosis not present

## 2019-12-25 DIAGNOSIS — S79922A Unspecified injury of left thigh, initial encounter: Secondary | ICD-10-CM | POA: Diagnosis not present

## 2019-12-25 DIAGNOSIS — S72001A Fracture of unspecified part of neck of right femur, initial encounter for closed fracture: Secondary | ICD-10-CM | POA: Diagnosis not present

## 2019-12-25 DIAGNOSIS — L89616 Pressure-induced deep tissue damage of right heel: Secondary | ICD-10-CM | POA: Diagnosis not present

## 2019-12-25 DIAGNOSIS — F17200 Nicotine dependence, unspecified, uncomplicated: Secondary | ICD-10-CM | POA: Diagnosis not present

## 2019-12-25 DIAGNOSIS — L89626 Pressure-induced deep tissue damage of left heel: Secondary | ICD-10-CM | POA: Diagnosis not present

## 2019-12-25 DIAGNOSIS — I491 Atrial premature depolarization: Secondary | ICD-10-CM | POA: Diagnosis not present

## 2019-12-25 DIAGNOSIS — R2689 Other abnormalities of gait and mobility: Secondary | ICD-10-CM | POA: Diagnosis not present

## 2019-12-25 DIAGNOSIS — I872 Venous insufficiency (chronic) (peripheral): Secondary | ICD-10-CM | POA: Diagnosis not present

## 2019-12-25 DIAGNOSIS — S72011A Unspecified intracapsular fracture of right femur, initial encounter for closed fracture: Secondary | ICD-10-CM | POA: Diagnosis not present

## 2019-12-25 DIAGNOSIS — I10 Essential (primary) hypertension: Secondary | ICD-10-CM | POA: Diagnosis not present

## 2019-12-25 DIAGNOSIS — S7291XS Unspecified fracture of right femur, sequela: Secondary | ICD-10-CM | POA: Diagnosis not present

## 2019-12-25 DIAGNOSIS — R609 Edema, unspecified: Secondary | ICD-10-CM | POA: Diagnosis not present

## 2019-12-25 DIAGNOSIS — Z9181 History of falling: Secondary | ICD-10-CM | POA: Diagnosis not present

## 2019-12-25 DIAGNOSIS — Z743 Need for continuous supervision: Secondary | ICD-10-CM | POA: Diagnosis not present

## 2019-12-25 DIAGNOSIS — I83018 Varicose veins of right lower extremity with ulcer other part of lower leg: Secondary | ICD-10-CM | POA: Diagnosis not present

## 2019-12-25 DIAGNOSIS — Z882 Allergy status to sulfonamides status: Secondary | ICD-10-CM | POA: Diagnosis not present

## 2019-12-25 DIAGNOSIS — G4700 Insomnia, unspecified: Secondary | ICD-10-CM | POA: Diagnosis not present

## 2019-12-25 DIAGNOSIS — Z20822 Contact with and (suspected) exposure to covid-19: Secondary | ICD-10-CM | POA: Diagnosis not present

## 2019-12-25 DIAGNOSIS — S79921A Unspecified injury of right thigh, initial encounter: Secondary | ICD-10-CM | POA: Diagnosis not present

## 2019-12-25 DIAGNOSIS — D62 Acute posthemorrhagic anemia: Secondary | ICD-10-CM | POA: Diagnosis not present

## 2019-12-25 DIAGNOSIS — S3993XA Unspecified injury of pelvis, initial encounter: Secondary | ICD-10-CM | POA: Diagnosis not present

## 2019-12-25 DIAGNOSIS — S72091A Other fracture of head and neck of right femur, initial encounter for closed fracture: Secondary | ICD-10-CM | POA: Diagnosis not present

## 2019-12-25 DIAGNOSIS — S72001D Fracture of unspecified part of neck of right femur, subsequent encounter for closed fracture with routine healing: Secondary | ICD-10-CM | POA: Diagnosis not present

## 2019-12-25 DIAGNOSIS — R41841 Cognitive communication deficit: Secondary | ICD-10-CM | POA: Diagnosis not present

## 2019-12-25 DIAGNOSIS — K7460 Unspecified cirrhosis of liver: Secondary | ICD-10-CM | POA: Diagnosis present

## 2019-12-25 DIAGNOSIS — S81801A Unspecified open wound, right lower leg, initial encounter: Secondary | ICD-10-CM | POA: Diagnosis not present

## 2019-12-25 DIAGNOSIS — M80051A Age-related osteoporosis with current pathological fracture, right femur, initial encounter for fracture: Secondary | ICD-10-CM | POA: Diagnosis not present

## 2019-12-25 DIAGNOSIS — L97919 Non-pressure chronic ulcer of unspecified part of right lower leg with unspecified severity: Secondary | ICD-10-CM | POA: Diagnosis not present

## 2019-12-25 DIAGNOSIS — S81802D Unspecified open wound, left lower leg, subsequent encounter: Secondary | ICD-10-CM | POA: Diagnosis not present

## 2019-12-25 DIAGNOSIS — Z86718 Personal history of other venous thrombosis and embolism: Secondary | ICD-10-CM | POA: Diagnosis not present

## 2019-12-25 DIAGNOSIS — Y929 Unspecified place or not applicable: Secondary | ICD-10-CM | POA: Diagnosis not present

## 2019-12-25 DIAGNOSIS — S8991XA Unspecified injury of right lower leg, initial encounter: Secondary | ICD-10-CM | POA: Diagnosis not present

## 2019-12-25 DIAGNOSIS — I509 Heart failure, unspecified: Secondary | ICD-10-CM | POA: Diagnosis present

## 2019-12-25 DIAGNOSIS — L89316 Pressure-induced deep tissue damage of right buttock: Secondary | ICD-10-CM | POA: Diagnosis not present

## 2019-12-25 DIAGNOSIS — Y939 Activity, unspecified: Secondary | ICD-10-CM | POA: Diagnosis not present

## 2019-12-25 DIAGNOSIS — L89152 Pressure ulcer of sacral region, stage 2: Secondary | ICD-10-CM | POA: Diagnosis present

## 2019-12-25 DIAGNOSIS — M069 Rheumatoid arthritis, unspecified: Secondary | ICD-10-CM | POA: Diagnosis not present

## 2019-12-25 DIAGNOSIS — F419 Anxiety disorder, unspecified: Secondary | ICD-10-CM | POA: Diagnosis not present

## 2019-12-25 DIAGNOSIS — I83029 Varicose veins of left lower extremity with ulcer of unspecified site: Secondary | ICD-10-CM | POA: Diagnosis not present

## 2019-12-25 DIAGNOSIS — E119 Type 2 diabetes mellitus without complications: Secondary | ICD-10-CM | POA: Diagnosis not present

## 2019-12-25 DIAGNOSIS — S81801D Unspecified open wound, right lower leg, subsequent encounter: Secondary | ICD-10-CM | POA: Diagnosis not present

## 2019-12-25 DIAGNOSIS — M6281 Muscle weakness (generalized): Secondary | ICD-10-CM | POA: Diagnosis not present

## 2019-12-25 DIAGNOSIS — Z7984 Long term (current) use of oral hypoglycemic drugs: Secondary | ICD-10-CM | POA: Diagnosis not present

## 2019-12-25 DIAGNOSIS — R279 Unspecified lack of coordination: Secondary | ICD-10-CM | POA: Diagnosis not present

## 2019-12-25 DIAGNOSIS — I110 Hypertensive heart disease with heart failure: Secondary | ICD-10-CM | POA: Diagnosis present

## 2019-12-25 DIAGNOSIS — Z7902 Long term (current) use of antithrombotics/antiplatelets: Secondary | ICD-10-CM | POA: Diagnosis not present

## 2019-12-25 DIAGNOSIS — S81802A Unspecified open wound, left lower leg, initial encounter: Secondary | ICD-10-CM | POA: Diagnosis not present

## 2019-12-25 DIAGNOSIS — M80851A Other osteoporosis with current pathological fracture, right femur, initial encounter for fracture: Secondary | ICD-10-CM | POA: Diagnosis not present

## 2019-12-25 DIAGNOSIS — Z7982 Long term (current) use of aspirin: Secondary | ICD-10-CM | POA: Diagnosis not present

## 2019-12-25 DIAGNOSIS — R1312 Dysphagia, oropharyngeal phase: Secondary | ICD-10-CM | POA: Diagnosis not present

## 2019-12-25 DIAGNOSIS — W19XXXA Unspecified fall, initial encounter: Secondary | ICD-10-CM | POA: Diagnosis not present

## 2019-12-25 DIAGNOSIS — I700 Atherosclerosis of aorta: Secondary | ICD-10-CM | POA: Diagnosis not present

## 2019-12-25 DIAGNOSIS — Z8673 Personal history of transient ischemic attack (TIA), and cerebral infarction without residual deficits: Secondary | ICD-10-CM | POA: Diagnosis not present

## 2019-12-30 DIAGNOSIS — I739 Peripheral vascular disease, unspecified: Secondary | ICD-10-CM | POA: Diagnosis not present

## 2019-12-30 DIAGNOSIS — S72011D Unspecified intracapsular fracture of right femur, subsequent encounter for closed fracture with routine healing: Secondary | ICD-10-CM | POA: Diagnosis not present

## 2019-12-30 DIAGNOSIS — L4050 Arthropathic psoriasis, unspecified: Secondary | ICD-10-CM | POA: Diagnosis not present

## 2019-12-30 DIAGNOSIS — M6281 Muscle weakness (generalized): Secondary | ICD-10-CM | POA: Diagnosis not present

## 2019-12-30 DIAGNOSIS — G4700 Insomnia, unspecified: Secondary | ICD-10-CM | POA: Diagnosis not present

## 2019-12-30 DIAGNOSIS — L89616 Pressure-induced deep tissue damage of right heel: Secondary | ICD-10-CM | POA: Diagnosis not present

## 2019-12-30 DIAGNOSIS — L89152 Pressure ulcer of sacral region, stage 2: Secondary | ICD-10-CM | POA: Diagnosis not present

## 2019-12-30 DIAGNOSIS — K7460 Unspecified cirrhosis of liver: Secondary | ICD-10-CM | POA: Diagnosis not present

## 2019-12-30 DIAGNOSIS — L89626 Pressure-induced deep tissue damage of left heel: Secondary | ICD-10-CM | POA: Diagnosis not present

## 2019-12-30 DIAGNOSIS — Z880 Allergy status to penicillin: Secondary | ICD-10-CM | POA: Diagnosis not present

## 2019-12-30 DIAGNOSIS — Z886 Allergy status to analgesic agent status: Secondary | ICD-10-CM | POA: Diagnosis not present

## 2019-12-30 DIAGNOSIS — R52 Pain, unspecified: Secondary | ICD-10-CM | POA: Diagnosis not present

## 2019-12-30 DIAGNOSIS — L97929 Non-pressure chronic ulcer of unspecified part of left lower leg with unspecified severity: Secondary | ICD-10-CM | POA: Diagnosis not present

## 2019-12-30 DIAGNOSIS — R279 Unspecified lack of coordination: Secondary | ICD-10-CM | POA: Diagnosis not present

## 2019-12-30 DIAGNOSIS — S81802D Unspecified open wound, left lower leg, subsequent encounter: Secondary | ICD-10-CM | POA: Diagnosis not present

## 2019-12-30 DIAGNOSIS — Z882 Allergy status to sulfonamides status: Secondary | ICD-10-CM | POA: Diagnosis not present

## 2019-12-30 DIAGNOSIS — M160 Bilateral primary osteoarthritis of hip: Secondary | ICD-10-CM | POA: Diagnosis not present

## 2019-12-30 DIAGNOSIS — E119 Type 2 diabetes mellitus without complications: Secondary | ICD-10-CM | POA: Diagnosis not present

## 2019-12-30 DIAGNOSIS — F419 Anxiety disorder, unspecified: Secondary | ICD-10-CM | POA: Diagnosis not present

## 2019-12-30 DIAGNOSIS — S81801D Unspecified open wound, right lower leg, subsequent encounter: Secondary | ICD-10-CM | POA: Diagnosis not present

## 2019-12-30 DIAGNOSIS — L97919 Non-pressure chronic ulcer of unspecified part of right lower leg with unspecified severity: Secondary | ICD-10-CM | POA: Diagnosis not present

## 2019-12-30 DIAGNOSIS — Z967 Presence of other bone and tendon implants: Secondary | ICD-10-CM | POA: Diagnosis not present

## 2019-12-30 DIAGNOSIS — E1165 Type 2 diabetes mellitus with hyperglycemia: Secondary | ICD-10-CM | POA: Diagnosis not present

## 2019-12-30 DIAGNOSIS — Z9181 History of falling: Secondary | ICD-10-CM | POA: Diagnosis not present

## 2019-12-30 DIAGNOSIS — L97909 Non-pressure chronic ulcer of unspecified part of unspecified lower leg with unspecified severity: Secondary | ICD-10-CM | POA: Diagnosis not present

## 2019-12-30 DIAGNOSIS — M85851 Other specified disorders of bone density and structure, right thigh: Secondary | ICD-10-CM | POA: Diagnosis not present

## 2019-12-30 DIAGNOSIS — Z743 Need for continuous supervision: Secondary | ICD-10-CM | POA: Diagnosis not present

## 2019-12-30 DIAGNOSIS — R2689 Other abnormalities of gait and mobility: Secondary | ICD-10-CM | POA: Diagnosis not present

## 2019-12-30 DIAGNOSIS — M069 Rheumatoid arthritis, unspecified: Secondary | ICD-10-CM | POA: Diagnosis not present

## 2019-12-30 DIAGNOSIS — Z299 Encounter for prophylactic measures, unspecified: Secondary | ICD-10-CM | POA: Diagnosis not present

## 2019-12-30 DIAGNOSIS — R1312 Dysphagia, oropharyngeal phase: Secondary | ICD-10-CM | POA: Diagnosis not present

## 2019-12-30 DIAGNOSIS — I10 Essential (primary) hypertension: Secondary | ICD-10-CM | POA: Diagnosis not present

## 2019-12-30 DIAGNOSIS — M810 Age-related osteoporosis without current pathological fracture: Secondary | ICD-10-CM | POA: Diagnosis not present

## 2019-12-30 DIAGNOSIS — S72001A Fracture of unspecified part of neck of right femur, initial encounter for closed fracture: Secondary | ICD-10-CM | POA: Diagnosis not present

## 2019-12-30 DIAGNOSIS — I959 Hypotension, unspecified: Secondary | ICD-10-CM | POA: Diagnosis not present

## 2019-12-30 DIAGNOSIS — S72001D Fracture of unspecified part of neck of right femur, subsequent encounter for closed fracture with routine healing: Secondary | ICD-10-CM | POA: Diagnosis not present

## 2019-12-30 DIAGNOSIS — R41841 Cognitive communication deficit: Secondary | ICD-10-CM | POA: Diagnosis not present

## 2019-12-30 DIAGNOSIS — Z9889 Other specified postprocedural states: Secondary | ICD-10-CM | POA: Diagnosis not present

## 2019-12-30 DIAGNOSIS — S72001S Fracture of unspecified part of neck of right femur, sequela: Secondary | ICD-10-CM | POA: Diagnosis not present

## 2019-12-30 DIAGNOSIS — M47818 Spondylosis without myelopathy or radiculopathy, sacral and sacrococcygeal region: Secondary | ICD-10-CM | POA: Diagnosis not present

## 2020-01-01 DIAGNOSIS — E1165 Type 2 diabetes mellitus with hyperglycemia: Secondary | ICD-10-CM | POA: Diagnosis not present

## 2020-01-01 DIAGNOSIS — M069 Rheumatoid arthritis, unspecified: Secondary | ICD-10-CM | POA: Diagnosis not present

## 2020-01-01 DIAGNOSIS — S72001S Fracture of unspecified part of neck of right femur, sequela: Secondary | ICD-10-CM | POA: Diagnosis not present

## 2020-01-01 DIAGNOSIS — L4050 Arthropathic psoriasis, unspecified: Secondary | ICD-10-CM | POA: Diagnosis not present

## 2020-01-12 DIAGNOSIS — L97909 Non-pressure chronic ulcer of unspecified part of unspecified lower leg with unspecified severity: Secondary | ICD-10-CM | POA: Diagnosis not present

## 2020-01-12 DIAGNOSIS — M069 Rheumatoid arthritis, unspecified: Secondary | ICD-10-CM | POA: Diagnosis not present

## 2020-01-12 DIAGNOSIS — K7460 Unspecified cirrhosis of liver: Secondary | ICD-10-CM | POA: Diagnosis not present

## 2020-01-12 DIAGNOSIS — Z299 Encounter for prophylactic measures, unspecified: Secondary | ICD-10-CM | POA: Diagnosis not present

## 2020-01-12 DIAGNOSIS — I739 Peripheral vascular disease, unspecified: Secondary | ICD-10-CM | POA: Diagnosis not present

## 2020-01-18 ENCOUNTER — Other Ambulatory Visit: Payer: Self-pay | Admitting: *Deleted

## 2020-01-18 NOTE — Patient Outreach (Signed)
Member screened for potential [facility] Care Management needs as a benefit of [employer].  Per Patient Pearletha [place]ge member resides in [facility].   Communication sent to [facility] SW to collaborate about anticipated dc plans and potential [facility] Care Management needs.  Will continue to follow while member resides in SNF.    Marthenia Rolling, MSN, RN,BSN [place] Acute Care Coordinator [phone] [facility]) [phone]  (Toll free office)

## 2020-01-19 ENCOUNTER — Other Ambulatory Visit: Payer: Self-pay | Admitting: *Deleted

## 2020-01-19 DIAGNOSIS — Z299 Encounter for prophylactic measures, unspecified: Secondary | ICD-10-CM | POA: Diagnosis not present

## 2020-01-19 DIAGNOSIS — K7460 Unspecified cirrhosis of liver: Secondary | ICD-10-CM | POA: Diagnosis not present

## 2020-01-19 DIAGNOSIS — F419 Anxiety disorder, unspecified: Secondary | ICD-10-CM | POA: Diagnosis not present

## 2020-01-19 DIAGNOSIS — I739 Peripheral vascular disease, unspecified: Secondary | ICD-10-CM | POA: Diagnosis not present

## 2020-01-19 DIAGNOSIS — M069 Rheumatoid arthritis, unspecified: Secondary | ICD-10-CM | POA: Diagnosis not present

## 2020-01-19 NOTE — Patient Outreach (Signed)
[facility] Post Acute Care Coordinator follow up. Member screened for potential [facility] Care Management needs as a benefit of [place] [employer].  Update received from [place] indicating member continues to progress with therapy. Transition plans and date to be determined.   Will continue to follow for transition plans and potential [facility] Care Management needs while member resides in SNF.    Marthenia Rolling, MSN, RN,BSN [place] Acute Care Coordinator [phone] [facility]) [phone]  (Toll free office)

## 2020-01-22 DIAGNOSIS — Z967 Presence of other bone and tendon implants: Secondary | ICD-10-CM | POA: Diagnosis not present

## 2020-01-22 DIAGNOSIS — Z9889 Other specified postprocedural states: Secondary | ICD-10-CM | POA: Diagnosis not present

## 2020-01-22 DIAGNOSIS — S72001D Fracture of unspecified part of neck of right femur, subsequent encounter for closed fracture with routine healing: Secondary | ICD-10-CM | POA: Diagnosis not present

## 2020-02-08 DIAGNOSIS — M47818 Spondylosis without myelopathy or radiculopathy, sacral and sacrococcygeal region: Secondary | ICD-10-CM | POA: Diagnosis not present

## 2020-02-08 DIAGNOSIS — Z9889 Other specified postprocedural states: Secondary | ICD-10-CM | POA: Diagnosis not present

## 2020-02-08 DIAGNOSIS — Z882 Allergy status to sulfonamides status: Secondary | ICD-10-CM | POA: Diagnosis not present

## 2020-02-08 DIAGNOSIS — S72011D Unspecified intracapsular fracture of right femur, subsequent encounter for closed fracture with routine healing: Secondary | ICD-10-CM | POA: Diagnosis not present

## 2020-02-08 DIAGNOSIS — S72001D Fracture of unspecified part of neck of right femur, subsequent encounter for closed fracture with routine healing: Secondary | ICD-10-CM | POA: Diagnosis not present

## 2020-02-08 DIAGNOSIS — M85851 Other specified disorders of bone density and structure, right thigh: Secondary | ICD-10-CM | POA: Diagnosis not present

## 2020-02-08 DIAGNOSIS — Z886 Allergy status to analgesic agent status: Secondary | ICD-10-CM | POA: Diagnosis not present

## 2020-02-08 DIAGNOSIS — M160 Bilateral primary osteoarthritis of hip: Secondary | ICD-10-CM | POA: Diagnosis not present

## 2020-02-08 DIAGNOSIS — Z880 Allergy status to penicillin: Secondary | ICD-10-CM | POA: Diagnosis not present

## 2020-02-11 DIAGNOSIS — I10 Essential (primary) hypertension: Secondary | ICD-10-CM | POA: Diagnosis not present

## 2020-02-11 DIAGNOSIS — F419 Anxiety disorder, unspecified: Secondary | ICD-10-CM | POA: Diagnosis not present

## 2020-02-11 DIAGNOSIS — Z299 Encounter for prophylactic measures, unspecified: Secondary | ICD-10-CM | POA: Diagnosis not present

## 2020-02-11 DIAGNOSIS — E1165 Type 2 diabetes mellitus with hyperglycemia: Secondary | ICD-10-CM | POA: Diagnosis not present

## 2020-02-11 DIAGNOSIS — M069 Rheumatoid arthritis, unspecified: Secondary | ICD-10-CM | POA: Diagnosis not present

## 2020-02-11 DIAGNOSIS — I739 Peripheral vascular disease, unspecified: Secondary | ICD-10-CM | POA: Diagnosis not present

## 2020-02-20 DIAGNOSIS — L89626 Pressure-induced deep tissue damage of left heel: Secondary | ICD-10-CM | POA: Diagnosis not present

## 2020-02-20 DIAGNOSIS — K7460 Unspecified cirrhosis of liver: Secondary | ICD-10-CM | POA: Diagnosis not present

## 2020-02-20 DIAGNOSIS — R41841 Cognitive communication deficit: Secondary | ICD-10-CM | POA: Diagnosis not present

## 2020-02-20 DIAGNOSIS — M069 Rheumatoid arthritis, unspecified: Secondary | ICD-10-CM | POA: Diagnosis not present

## 2020-02-20 DIAGNOSIS — E119 Type 2 diabetes mellitus without complications: Secondary | ICD-10-CM | POA: Diagnosis not present

## 2020-02-20 DIAGNOSIS — Z9181 History of falling: Secondary | ICD-10-CM | POA: Diagnosis not present

## 2020-02-20 DIAGNOSIS — R1312 Dysphagia, oropharyngeal phase: Secondary | ICD-10-CM | POA: Diagnosis not present

## 2020-02-20 DIAGNOSIS — G4700 Insomnia, unspecified: Secondary | ICD-10-CM | POA: Diagnosis not present

## 2020-02-20 DIAGNOSIS — L89616 Pressure-induced deep tissue damage of right heel: Secondary | ICD-10-CM | POA: Diagnosis not present

## 2020-02-20 DIAGNOSIS — L97909 Non-pressure chronic ulcer of unspecified part of unspecified lower leg with unspecified severity: Secondary | ICD-10-CM | POA: Diagnosis not present

## 2020-02-20 DIAGNOSIS — L89152 Pressure ulcer of sacral region, stage 2: Secondary | ICD-10-CM | POA: Diagnosis not present

## 2020-02-20 DIAGNOSIS — S81801D Unspecified open wound, right lower leg, subsequent encounter: Secondary | ICD-10-CM | POA: Diagnosis not present

## 2020-02-20 DIAGNOSIS — S81802D Unspecified open wound, left lower leg, subsequent encounter: Secondary | ICD-10-CM | POA: Diagnosis not present

## 2020-02-20 DIAGNOSIS — F419 Anxiety disorder, unspecified: Secondary | ICD-10-CM | POA: Diagnosis not present

## 2020-02-20 DIAGNOSIS — Z299 Encounter for prophylactic measures, unspecified: Secondary | ICD-10-CM | POA: Diagnosis not present

## 2020-02-20 DIAGNOSIS — M6281 Muscle weakness (generalized): Secondary | ICD-10-CM | POA: Diagnosis not present

## 2020-02-20 DIAGNOSIS — R2689 Other abnormalities of gait and mobility: Secondary | ICD-10-CM | POA: Diagnosis not present

## 2020-02-20 DIAGNOSIS — S72001D Fracture of unspecified part of neck of right femur, subsequent encounter for closed fracture with routine healing: Secondary | ICD-10-CM | POA: Diagnosis not present

## 2020-02-23 DIAGNOSIS — F419 Anxiety disorder, unspecified: Secondary | ICD-10-CM | POA: Diagnosis not present

## 2020-02-23 DIAGNOSIS — K7460 Unspecified cirrhosis of liver: Secondary | ICD-10-CM | POA: Diagnosis not present

## 2020-02-23 DIAGNOSIS — L97909 Non-pressure chronic ulcer of unspecified part of unspecified lower leg with unspecified severity: Secondary | ICD-10-CM | POA: Diagnosis not present

## 2020-02-23 DIAGNOSIS — M069 Rheumatoid arthritis, unspecified: Secondary | ICD-10-CM | POA: Diagnosis not present

## 2020-02-23 DIAGNOSIS — Z299 Encounter for prophylactic measures, unspecified: Secondary | ICD-10-CM | POA: Diagnosis not present

## 2020-03-01 ENCOUNTER — Other Ambulatory Visit: Payer: Self-pay | Admitting: *Deleted

## 2020-03-10 DIAGNOSIS — Z299 Encounter for prophylactic measures, unspecified: Secondary | ICD-10-CM | POA: Diagnosis not present

## 2020-03-10 DIAGNOSIS — L4050 Arthropathic psoriasis, unspecified: Secondary | ICD-10-CM | POA: Diagnosis not present

## 2020-03-10 DIAGNOSIS — M810 Age-related osteoporosis without current pathological fracture: Secondary | ICD-10-CM | POA: Diagnosis not present

## 2020-03-10 DIAGNOSIS — F419 Anxiety disorder, unspecified: Secondary | ICD-10-CM | POA: Diagnosis not present

## 2020-03-10 DIAGNOSIS — E1165 Type 2 diabetes mellitus with hyperglycemia: Secondary | ICD-10-CM | POA: Diagnosis not present

## 2020-03-11 DIAGNOSIS — Z7984 Long term (current) use of oral hypoglycemic drugs: Secondary | ICD-10-CM | POA: Diagnosis not present

## 2020-03-11 DIAGNOSIS — E1151 Type 2 diabetes mellitus with diabetic peripheral angiopathy without gangrene: Secondary | ICD-10-CM | POA: Diagnosis not present

## 2020-03-11 DIAGNOSIS — R1310 Dysphagia, unspecified: Secondary | ICD-10-CM | POA: Diagnosis not present

## 2020-03-11 DIAGNOSIS — Z9181 History of falling: Secondary | ICD-10-CM | POA: Diagnosis not present

## 2020-03-11 DIAGNOSIS — D638 Anemia in other chronic diseases classified elsewhere: Secondary | ICD-10-CM | POA: Diagnosis not present

## 2020-03-11 DIAGNOSIS — K7460 Unspecified cirrhosis of liver: Secondary | ICD-10-CM | POA: Diagnosis not present

## 2020-03-11 DIAGNOSIS — W19XXXD Unspecified fall, subsequent encounter: Secondary | ICD-10-CM | POA: Diagnosis not present

## 2020-03-11 DIAGNOSIS — M1990 Unspecified osteoarthritis, unspecified site: Secondary | ICD-10-CM | POA: Diagnosis not present

## 2020-03-11 DIAGNOSIS — L4050 Arthropathic psoriasis, unspecified: Secondary | ICD-10-CM | POA: Diagnosis not present

## 2020-03-11 DIAGNOSIS — R41841 Cognitive communication deficit: Secondary | ICD-10-CM | POA: Diagnosis not present

## 2020-03-11 DIAGNOSIS — Z79899 Other long term (current) drug therapy: Secondary | ICD-10-CM | POA: Diagnosis not present

## 2020-03-11 DIAGNOSIS — M80051D Age-related osteoporosis with current pathological fracture, right femur, subsequent encounter for fracture with routine healing: Secondary | ICD-10-CM | POA: Diagnosis not present

## 2020-03-11 DIAGNOSIS — F40240 Claustrophobia: Secondary | ICD-10-CM | POA: Diagnosis not present

## 2020-03-11 DIAGNOSIS — E559 Vitamin D deficiency, unspecified: Secondary | ICD-10-CM | POA: Diagnosis not present

## 2020-03-11 DIAGNOSIS — K5900 Constipation, unspecified: Secondary | ICD-10-CM | POA: Diagnosis not present

## 2020-03-11 DIAGNOSIS — Z79891 Long term (current) use of opiate analgesic: Secondary | ICD-10-CM | POA: Diagnosis not present

## 2020-03-11 DIAGNOSIS — M069 Rheumatoid arthritis, unspecified: Secondary | ICD-10-CM | POA: Diagnosis not present

## 2020-03-11 DIAGNOSIS — F419 Anxiety disorder, unspecified: Secondary | ICD-10-CM | POA: Diagnosis not present

## 2020-03-11 DIAGNOSIS — D692 Other nonthrombocytopenic purpura: Secondary | ICD-10-CM | POA: Diagnosis not present

## 2020-03-11 DIAGNOSIS — K219 Gastro-esophageal reflux disease without esophagitis: Secondary | ICD-10-CM | POA: Diagnosis not present

## 2020-03-11 DIAGNOSIS — E7800 Pure hypercholesterolemia, unspecified: Secondary | ICD-10-CM | POA: Diagnosis not present

## 2020-03-11 DIAGNOSIS — Z7982 Long term (current) use of aspirin: Secondary | ICD-10-CM | POA: Diagnosis not present

## 2020-03-11 DIAGNOSIS — I10 Essential (primary) hypertension: Secondary | ICD-10-CM | POA: Diagnosis not present

## 2020-03-13 DIAGNOSIS — M1990 Unspecified osteoarthritis, unspecified site: Secondary | ICD-10-CM | POA: Diagnosis not present

## 2020-03-13 DIAGNOSIS — I10 Essential (primary) hypertension: Secondary | ICD-10-CM | POA: Diagnosis not present

## 2020-03-13 DIAGNOSIS — M069 Rheumatoid arthritis, unspecified: Secondary | ICD-10-CM | POA: Diagnosis not present

## 2020-03-13 DIAGNOSIS — E1151 Type 2 diabetes mellitus with diabetic peripheral angiopathy without gangrene: Secondary | ICD-10-CM | POA: Diagnosis not present

## 2020-03-13 DIAGNOSIS — L4050 Arthropathic psoriasis, unspecified: Secondary | ICD-10-CM | POA: Diagnosis not present

## 2020-03-13 DIAGNOSIS — M80051D Age-related osteoporosis with current pathological fracture, right femur, subsequent encounter for fracture with routine healing: Secondary | ICD-10-CM | POA: Diagnosis not present

## 2020-03-14 DIAGNOSIS — E1151 Type 2 diabetes mellitus with diabetic peripheral angiopathy without gangrene: Secondary | ICD-10-CM | POA: Diagnosis not present

## 2020-03-14 DIAGNOSIS — M069 Rheumatoid arthritis, unspecified: Secondary | ICD-10-CM | POA: Diagnosis not present

## 2020-03-14 DIAGNOSIS — I10 Essential (primary) hypertension: Secondary | ICD-10-CM | POA: Diagnosis not present

## 2020-03-14 DIAGNOSIS — L4050 Arthropathic psoriasis, unspecified: Secondary | ICD-10-CM | POA: Diagnosis not present

## 2020-03-14 DIAGNOSIS — M80051D Age-related osteoporosis with current pathological fracture, right femur, subsequent encounter for fracture with routine healing: Secondary | ICD-10-CM | POA: Diagnosis not present

## 2020-03-14 DIAGNOSIS — M1990 Unspecified osteoarthritis, unspecified site: Secondary | ICD-10-CM | POA: Diagnosis not present

## 2020-03-15 DIAGNOSIS — R5381 Other malaise: Secondary | ICD-10-CM | POA: Diagnosis not present

## 2020-03-15 DIAGNOSIS — M80051D Age-related osteoporosis with current pathological fracture, right femur, subsequent encounter for fracture with routine healing: Secondary | ICD-10-CM | POA: Diagnosis not present

## 2020-03-15 DIAGNOSIS — I10 Essential (primary) hypertension: Secondary | ICD-10-CM | POA: Diagnosis not present

## 2020-03-15 DIAGNOSIS — E1151 Type 2 diabetes mellitus with diabetic peripheral angiopathy without gangrene: Secondary | ICD-10-CM | POA: Diagnosis not present

## 2020-03-15 DIAGNOSIS — M069 Rheumatoid arthritis, unspecified: Secondary | ICD-10-CM | POA: Diagnosis not present

## 2020-03-15 DIAGNOSIS — R531 Weakness: Secondary | ICD-10-CM | POA: Diagnosis not present

## 2020-03-15 DIAGNOSIS — M1990 Unspecified osteoarthritis, unspecified site: Secondary | ICD-10-CM | POA: Diagnosis not present

## 2020-03-15 DIAGNOSIS — L4050 Arthropathic psoriasis, unspecified: Secondary | ICD-10-CM | POA: Diagnosis not present

## 2020-03-17 DIAGNOSIS — M80051D Age-related osteoporosis with current pathological fracture, right femur, subsequent encounter for fracture with routine healing: Secondary | ICD-10-CM | POA: Diagnosis not present

## 2020-03-17 DIAGNOSIS — M069 Rheumatoid arthritis, unspecified: Secondary | ICD-10-CM | POA: Diagnosis not present

## 2020-03-17 DIAGNOSIS — M1990 Unspecified osteoarthritis, unspecified site: Secondary | ICD-10-CM | POA: Diagnosis not present

## 2020-03-17 DIAGNOSIS — L4050 Arthropathic psoriasis, unspecified: Secondary | ICD-10-CM | POA: Diagnosis not present

## 2020-03-17 DIAGNOSIS — E1151 Type 2 diabetes mellitus with diabetic peripheral angiopathy without gangrene: Secondary | ICD-10-CM | POA: Diagnosis not present

## 2020-03-17 DIAGNOSIS — I10 Essential (primary) hypertension: Secondary | ICD-10-CM | POA: Diagnosis not present

## 2020-03-22 ENCOUNTER — Other Ambulatory Visit: Payer: Self-pay | Admitting: *Deleted

## 2020-03-22 DIAGNOSIS — M069 Rheumatoid arthritis, unspecified: Secondary | ICD-10-CM | POA: Diagnosis not present

## 2020-03-22 DIAGNOSIS — M1990 Unspecified osteoarthritis, unspecified site: Secondary | ICD-10-CM | POA: Diagnosis not present

## 2020-03-22 DIAGNOSIS — L4050 Arthropathic psoriasis, unspecified: Secondary | ICD-10-CM | POA: Diagnosis not present

## 2020-03-22 DIAGNOSIS — M80051D Age-related osteoporosis with current pathological fracture, right femur, subsequent encounter for fracture with routine healing: Secondary | ICD-10-CM | POA: Diagnosis not present

## 2020-03-22 DIAGNOSIS — I10 Essential (primary) hypertension: Secondary | ICD-10-CM | POA: Diagnosis not present

## 2020-03-22 DIAGNOSIS — E1151 Type 2 diabetes mellitus with diabetic peripheral angiopathy without gangrene: Secondary | ICD-10-CM | POA: Diagnosis not present

## 2020-03-22 NOTE — Patient Outreach (Signed)
[facility] Post- Acute Care Coordinator follow up. Member screened for potential [facility] Care Management needs.  Telephone call made to Mrs. [name] [phone]. Patient identifiers confirmed. Member recently returned home from [facility].   Explained [place] Management services. [name] reports "I am fine." Denies having any [facility] Care Management needs.   Angela Rolling, MSN, RN,BSN [place] Acute Care Coordinator [phone] [facility]) [phone]  (Toll free office)

## 2020-03-23 DIAGNOSIS — E1151 Type 2 diabetes mellitus with diabetic peripheral angiopathy without gangrene: Secondary | ICD-10-CM | POA: Diagnosis not present

## 2020-03-23 DIAGNOSIS — M80051D Age-related osteoporosis with current pathological fracture, right femur, subsequent encounter for fracture with routine healing: Secondary | ICD-10-CM | POA: Diagnosis not present

## 2020-03-23 DIAGNOSIS — I10 Essential (primary) hypertension: Secondary | ICD-10-CM | POA: Diagnosis not present

## 2020-03-23 DIAGNOSIS — M069 Rheumatoid arthritis, unspecified: Secondary | ICD-10-CM | POA: Diagnosis not present

## 2020-03-23 DIAGNOSIS — L4050 Arthropathic psoriasis, unspecified: Secondary | ICD-10-CM | POA: Diagnosis not present

## 2020-03-23 DIAGNOSIS — M1990 Unspecified osteoarthritis, unspecified site: Secondary | ICD-10-CM | POA: Diagnosis not present

## 2020-03-24 DIAGNOSIS — M80051D Age-related osteoporosis with current pathological fracture, right femur, subsequent encounter for fracture with routine healing: Secondary | ICD-10-CM | POA: Diagnosis not present

## 2020-03-24 DIAGNOSIS — E1151 Type 2 diabetes mellitus with diabetic peripheral angiopathy without gangrene: Secondary | ICD-10-CM | POA: Diagnosis not present

## 2020-03-24 DIAGNOSIS — M069 Rheumatoid arthritis, unspecified: Secondary | ICD-10-CM | POA: Diagnosis not present

## 2020-03-24 DIAGNOSIS — I10 Essential (primary) hypertension: Secondary | ICD-10-CM | POA: Diagnosis not present

## 2020-03-24 DIAGNOSIS — L4050 Arthropathic psoriasis, unspecified: Secondary | ICD-10-CM | POA: Diagnosis not present

## 2020-03-24 DIAGNOSIS — M1990 Unspecified osteoarthritis, unspecified site: Secondary | ICD-10-CM | POA: Diagnosis not present

## 2020-03-25 DIAGNOSIS — M069 Rheumatoid arthritis, unspecified: Secondary | ICD-10-CM | POA: Diagnosis not present

## 2020-03-25 DIAGNOSIS — I10 Essential (primary) hypertension: Secondary | ICD-10-CM | POA: Diagnosis not present

## 2020-03-25 DIAGNOSIS — M1990 Unspecified osteoarthritis, unspecified site: Secondary | ICD-10-CM | POA: Diagnosis not present

## 2020-03-25 DIAGNOSIS — E1151 Type 2 diabetes mellitus with diabetic peripheral angiopathy without gangrene: Secondary | ICD-10-CM | POA: Diagnosis not present

## 2020-03-25 DIAGNOSIS — L4050 Arthropathic psoriasis, unspecified: Secondary | ICD-10-CM | POA: Diagnosis not present

## 2020-03-25 DIAGNOSIS — M80051D Age-related osteoporosis with current pathological fracture, right femur, subsequent encounter for fracture with routine healing: Secondary | ICD-10-CM | POA: Diagnosis not present

## 2020-03-28 DIAGNOSIS — M80051D Age-related osteoporosis with current pathological fracture, right femur, subsequent encounter for fracture with routine healing: Secondary | ICD-10-CM | POA: Diagnosis not present

## 2020-03-28 DIAGNOSIS — E1151 Type 2 diabetes mellitus with diabetic peripheral angiopathy without gangrene: Secondary | ICD-10-CM | POA: Diagnosis not present

## 2020-03-28 DIAGNOSIS — I10 Essential (primary) hypertension: Secondary | ICD-10-CM | POA: Diagnosis not present

## 2020-03-28 DIAGNOSIS — L4050 Arthropathic psoriasis, unspecified: Secondary | ICD-10-CM | POA: Diagnosis not present

## 2020-03-28 DIAGNOSIS — M1990 Unspecified osteoarthritis, unspecified site: Secondary | ICD-10-CM | POA: Diagnosis not present

## 2020-03-28 DIAGNOSIS — M069 Rheumatoid arthritis, unspecified: Secondary | ICD-10-CM | POA: Diagnosis not present

## 2020-03-29 DIAGNOSIS — M069 Rheumatoid arthritis, unspecified: Secondary | ICD-10-CM | POA: Diagnosis not present

## 2020-03-29 DIAGNOSIS — M1990 Unspecified osteoarthritis, unspecified site: Secondary | ICD-10-CM | POA: Diagnosis not present

## 2020-03-29 DIAGNOSIS — M80051D Age-related osteoporosis with current pathological fracture, right femur, subsequent encounter for fracture with routine healing: Secondary | ICD-10-CM | POA: Diagnosis not present

## 2020-03-29 DIAGNOSIS — E1151 Type 2 diabetes mellitus with diabetic peripheral angiopathy without gangrene: Secondary | ICD-10-CM | POA: Diagnosis not present

## 2020-03-29 DIAGNOSIS — I10 Essential (primary) hypertension: Secondary | ICD-10-CM | POA: Diagnosis not present

## 2020-03-29 DIAGNOSIS — L4050 Arthropathic psoriasis, unspecified: Secondary | ICD-10-CM | POA: Diagnosis not present

## 2020-03-31 DIAGNOSIS — L4050 Arthropathic psoriasis, unspecified: Secondary | ICD-10-CM | POA: Diagnosis not present

## 2020-03-31 DIAGNOSIS — I10 Essential (primary) hypertension: Secondary | ICD-10-CM | POA: Diagnosis not present

## 2020-03-31 DIAGNOSIS — M069 Rheumatoid arthritis, unspecified: Secondary | ICD-10-CM | POA: Diagnosis not present

## 2020-03-31 DIAGNOSIS — E1151 Type 2 diabetes mellitus with diabetic peripheral angiopathy without gangrene: Secondary | ICD-10-CM | POA: Diagnosis not present

## 2020-03-31 DIAGNOSIS — M80051D Age-related osteoporosis with current pathological fracture, right femur, subsequent encounter for fracture with routine healing: Secondary | ICD-10-CM | POA: Diagnosis not present

## 2020-03-31 DIAGNOSIS — M1990 Unspecified osteoarthritis, unspecified site: Secondary | ICD-10-CM | POA: Diagnosis not present

## 2020-04-01 DIAGNOSIS — L4050 Arthropathic psoriasis, unspecified: Secondary | ICD-10-CM | POA: Diagnosis not present

## 2020-04-01 DIAGNOSIS — I10 Essential (primary) hypertension: Secondary | ICD-10-CM | POA: Diagnosis not present

## 2020-04-01 DIAGNOSIS — M1990 Unspecified osteoarthritis, unspecified site: Secondary | ICD-10-CM | POA: Diagnosis not present

## 2020-04-01 DIAGNOSIS — E1151 Type 2 diabetes mellitus with diabetic peripheral angiopathy without gangrene: Secondary | ICD-10-CM | POA: Diagnosis not present

## 2020-04-01 DIAGNOSIS — M80051D Age-related osteoporosis with current pathological fracture, right femur, subsequent encounter for fracture with routine healing: Secondary | ICD-10-CM | POA: Diagnosis not present

## 2020-04-01 DIAGNOSIS — M069 Rheumatoid arthritis, unspecified: Secondary | ICD-10-CM | POA: Diagnosis not present

## 2020-04-04 DIAGNOSIS — M1990 Unspecified osteoarthritis, unspecified site: Secondary | ICD-10-CM | POA: Diagnosis not present

## 2020-04-04 DIAGNOSIS — N390 Urinary tract infection, site not specified: Secondary | ICD-10-CM | POA: Diagnosis not present

## 2020-04-04 DIAGNOSIS — I10 Essential (primary) hypertension: Secondary | ICD-10-CM | POA: Diagnosis not present

## 2020-04-04 DIAGNOSIS — E1151 Type 2 diabetes mellitus with diabetic peripheral angiopathy without gangrene: Secondary | ICD-10-CM | POA: Diagnosis not present

## 2020-04-04 DIAGNOSIS — L4050 Arthropathic psoriasis, unspecified: Secondary | ICD-10-CM | POA: Diagnosis not present

## 2020-04-04 DIAGNOSIS — M80051D Age-related osteoporosis with current pathological fracture, right femur, subsequent encounter for fracture with routine healing: Secondary | ICD-10-CM | POA: Diagnosis not present

## 2020-04-04 DIAGNOSIS — M069 Rheumatoid arthritis, unspecified: Secondary | ICD-10-CM | POA: Diagnosis not present

## 2020-04-06 DIAGNOSIS — M80051D Age-related osteoporosis with current pathological fracture, right femur, subsequent encounter for fracture with routine healing: Secondary | ICD-10-CM | POA: Diagnosis not present

## 2020-04-07 DIAGNOSIS — M069 Rheumatoid arthritis, unspecified: Secondary | ICD-10-CM | POA: Diagnosis not present

## 2020-04-07 DIAGNOSIS — M80051D Age-related osteoporosis with current pathological fracture, right femur, subsequent encounter for fracture with routine healing: Secondary | ICD-10-CM | POA: Diagnosis not present

## 2020-04-07 DIAGNOSIS — M1990 Unspecified osteoarthritis, unspecified site: Secondary | ICD-10-CM | POA: Diagnosis not present

## 2020-04-07 DIAGNOSIS — I10 Essential (primary) hypertension: Secondary | ICD-10-CM | POA: Diagnosis not present

## 2020-04-07 DIAGNOSIS — E1151 Type 2 diabetes mellitus with diabetic peripheral angiopathy without gangrene: Secondary | ICD-10-CM | POA: Diagnosis not present

## 2020-04-07 DIAGNOSIS — L4050 Arthropathic psoriasis, unspecified: Secondary | ICD-10-CM | POA: Diagnosis not present

## 2020-04-08 DIAGNOSIS — M1990 Unspecified osteoarthritis, unspecified site: Secondary | ICD-10-CM | POA: Diagnosis not present

## 2020-04-08 DIAGNOSIS — L4050 Arthropathic psoriasis, unspecified: Secondary | ICD-10-CM | POA: Diagnosis not present

## 2020-04-08 DIAGNOSIS — I10 Essential (primary) hypertension: Secondary | ICD-10-CM | POA: Diagnosis not present

## 2020-04-08 DIAGNOSIS — M80051D Age-related osteoporosis with current pathological fracture, right femur, subsequent encounter for fracture with routine healing: Secondary | ICD-10-CM | POA: Diagnosis not present

## 2020-04-08 DIAGNOSIS — E1151 Type 2 diabetes mellitus with diabetic peripheral angiopathy without gangrene: Secondary | ICD-10-CM | POA: Diagnosis not present

## 2020-04-08 DIAGNOSIS — M069 Rheumatoid arthritis, unspecified: Secondary | ICD-10-CM | POA: Diagnosis not present

## 2020-04-10 DIAGNOSIS — M80051D Age-related osteoporosis with current pathological fracture, right femur, subsequent encounter for fracture with routine healing: Secondary | ICD-10-CM | POA: Diagnosis not present

## 2020-04-10 DIAGNOSIS — R1310 Dysphagia, unspecified: Secondary | ICD-10-CM | POA: Diagnosis not present

## 2020-04-10 DIAGNOSIS — M069 Rheumatoid arthritis, unspecified: Secondary | ICD-10-CM | POA: Diagnosis not present

## 2020-04-10 DIAGNOSIS — E1151 Type 2 diabetes mellitus with diabetic peripheral angiopathy without gangrene: Secondary | ICD-10-CM | POA: Diagnosis not present

## 2020-04-10 DIAGNOSIS — W19XXXD Unspecified fall, subsequent encounter: Secondary | ICD-10-CM | POA: Diagnosis not present

## 2020-04-10 DIAGNOSIS — E7800 Pure hypercholesterolemia, unspecified: Secondary | ICD-10-CM | POA: Diagnosis not present

## 2020-04-10 DIAGNOSIS — D692 Other nonthrombocytopenic purpura: Secondary | ICD-10-CM | POA: Diagnosis not present

## 2020-04-10 DIAGNOSIS — Z7984 Long term (current) use of oral hypoglycemic drugs: Secondary | ICD-10-CM | POA: Diagnosis not present

## 2020-04-10 DIAGNOSIS — K219 Gastro-esophageal reflux disease without esophagitis: Secondary | ICD-10-CM | POA: Diagnosis not present

## 2020-04-10 DIAGNOSIS — I10 Essential (primary) hypertension: Secondary | ICD-10-CM | POA: Diagnosis not present

## 2020-04-10 DIAGNOSIS — Z9181 History of falling: Secondary | ICD-10-CM | POA: Diagnosis not present

## 2020-04-10 DIAGNOSIS — Z79891 Long term (current) use of opiate analgesic: Secondary | ICD-10-CM | POA: Diagnosis not present

## 2020-04-10 DIAGNOSIS — R41841 Cognitive communication deficit: Secondary | ICD-10-CM | POA: Diagnosis not present

## 2020-04-10 DIAGNOSIS — D638 Anemia in other chronic diseases classified elsewhere: Secondary | ICD-10-CM | POA: Diagnosis not present

## 2020-04-10 DIAGNOSIS — Z79899 Other long term (current) drug therapy: Secondary | ICD-10-CM | POA: Diagnosis not present

## 2020-04-10 DIAGNOSIS — K7460 Unspecified cirrhosis of liver: Secondary | ICD-10-CM | POA: Diagnosis not present

## 2020-04-10 DIAGNOSIS — F40240 Claustrophobia: Secondary | ICD-10-CM | POA: Diagnosis not present

## 2020-04-10 DIAGNOSIS — K5900 Constipation, unspecified: Secondary | ICD-10-CM | POA: Diagnosis not present

## 2020-04-10 DIAGNOSIS — L4050 Arthropathic psoriasis, unspecified: Secondary | ICD-10-CM | POA: Diagnosis not present

## 2020-04-10 DIAGNOSIS — Z7982 Long term (current) use of aspirin: Secondary | ICD-10-CM | POA: Diagnosis not present

## 2020-04-10 DIAGNOSIS — F419 Anxiety disorder, unspecified: Secondary | ICD-10-CM | POA: Diagnosis not present

## 2020-04-10 DIAGNOSIS — M1990 Unspecified osteoarthritis, unspecified site: Secondary | ICD-10-CM | POA: Diagnosis not present

## 2020-04-10 DIAGNOSIS — E559 Vitamin D deficiency, unspecified: Secondary | ICD-10-CM | POA: Diagnosis not present

## 2020-04-12 DIAGNOSIS — E1151 Type 2 diabetes mellitus with diabetic peripheral angiopathy without gangrene: Secondary | ICD-10-CM | POA: Diagnosis not present

## 2020-04-12 DIAGNOSIS — L4050 Arthropathic psoriasis, unspecified: Secondary | ICD-10-CM | POA: Diagnosis not present

## 2020-04-12 DIAGNOSIS — M069 Rheumatoid arthritis, unspecified: Secondary | ICD-10-CM | POA: Diagnosis not present

## 2020-04-12 DIAGNOSIS — M80051D Age-related osteoporosis with current pathological fracture, right femur, subsequent encounter for fracture with routine healing: Secondary | ICD-10-CM | POA: Diagnosis not present

## 2020-04-12 DIAGNOSIS — I10 Essential (primary) hypertension: Secondary | ICD-10-CM | POA: Diagnosis not present

## 2020-04-12 DIAGNOSIS — M1990 Unspecified osteoarthritis, unspecified site: Secondary | ICD-10-CM | POA: Diagnosis not present

## 2020-04-19 DIAGNOSIS — Z79899 Other long term (current) drug therapy: Secondary | ICD-10-CM | POA: Diagnosis not present

## 2020-04-19 DIAGNOSIS — Z299 Encounter for prophylactic measures, unspecified: Secondary | ICD-10-CM | POA: Diagnosis not present

## 2020-04-19 DIAGNOSIS — M80051D Age-related osteoporosis with current pathological fracture, right femur, subsequent encounter for fracture with routine healing: Secondary | ICD-10-CM | POA: Diagnosis not present

## 2020-04-19 DIAGNOSIS — M069 Rheumatoid arthritis, unspecified: Secondary | ICD-10-CM | POA: Diagnosis not present

## 2020-04-19 DIAGNOSIS — I10 Essential (primary) hypertension: Secondary | ICD-10-CM | POA: Diagnosis not present

## 2020-04-19 DIAGNOSIS — L97909 Non-pressure chronic ulcer of unspecified part of unspecified lower leg with unspecified severity: Secondary | ICD-10-CM | POA: Diagnosis not present

## 2020-04-19 DIAGNOSIS — I739 Peripheral vascular disease, unspecified: Secondary | ICD-10-CM | POA: Diagnosis not present

## 2020-04-19 DIAGNOSIS — L4050 Arthropathic psoriasis, unspecified: Secondary | ICD-10-CM | POA: Diagnosis not present

## 2020-04-19 DIAGNOSIS — M1990 Unspecified osteoarthritis, unspecified site: Secondary | ICD-10-CM | POA: Diagnosis not present

## 2020-04-19 DIAGNOSIS — E1151 Type 2 diabetes mellitus with diabetic peripheral angiopathy without gangrene: Secondary | ICD-10-CM | POA: Diagnosis not present

## 2020-04-22 DIAGNOSIS — M1990 Unspecified osteoarthritis, unspecified site: Secondary | ICD-10-CM | POA: Diagnosis not present

## 2020-04-22 DIAGNOSIS — M80051D Age-related osteoporosis with current pathological fracture, right femur, subsequent encounter for fracture with routine healing: Secondary | ICD-10-CM | POA: Diagnosis not present

## 2020-04-22 DIAGNOSIS — L4050 Arthropathic psoriasis, unspecified: Secondary | ICD-10-CM | POA: Diagnosis not present

## 2020-04-22 DIAGNOSIS — M069 Rheumatoid arthritis, unspecified: Secondary | ICD-10-CM | POA: Diagnosis not present

## 2020-04-22 DIAGNOSIS — I10 Essential (primary) hypertension: Secondary | ICD-10-CM | POA: Diagnosis not present

## 2020-04-22 DIAGNOSIS — E1151 Type 2 diabetes mellitus with diabetic peripheral angiopathy without gangrene: Secondary | ICD-10-CM | POA: Diagnosis not present

## 2020-04-26 DIAGNOSIS — M1990 Unspecified osteoarthritis, unspecified site: Secondary | ICD-10-CM | POA: Diagnosis not present

## 2020-04-26 DIAGNOSIS — E1151 Type 2 diabetes mellitus with diabetic peripheral angiopathy without gangrene: Secondary | ICD-10-CM | POA: Diagnosis not present

## 2020-04-26 DIAGNOSIS — M069 Rheumatoid arthritis, unspecified: Secondary | ICD-10-CM | POA: Diagnosis not present

## 2020-04-26 DIAGNOSIS — I10 Essential (primary) hypertension: Secondary | ICD-10-CM | POA: Diagnosis not present

## 2020-04-26 DIAGNOSIS — M80051D Age-related osteoporosis with current pathological fracture, right femur, subsequent encounter for fracture with routine healing: Secondary | ICD-10-CM | POA: Diagnosis not present

## 2020-04-26 DIAGNOSIS — L4050 Arthropathic psoriasis, unspecified: Secondary | ICD-10-CM | POA: Diagnosis not present

## 2020-04-29 DIAGNOSIS — M1990 Unspecified osteoarthritis, unspecified site: Secondary | ICD-10-CM | POA: Diagnosis not present

## 2020-04-29 DIAGNOSIS — E1151 Type 2 diabetes mellitus with diabetic peripheral angiopathy without gangrene: Secondary | ICD-10-CM | POA: Diagnosis not present

## 2020-04-29 DIAGNOSIS — M80051D Age-related osteoporosis with current pathological fracture, right femur, subsequent encounter for fracture with routine healing: Secondary | ICD-10-CM | POA: Diagnosis not present

## 2020-04-29 DIAGNOSIS — L4050 Arthropathic psoriasis, unspecified: Secondary | ICD-10-CM | POA: Diagnosis not present

## 2020-04-29 DIAGNOSIS — M069 Rheumatoid arthritis, unspecified: Secondary | ICD-10-CM | POA: Diagnosis not present

## 2020-04-29 DIAGNOSIS — I10 Essential (primary) hypertension: Secondary | ICD-10-CM | POA: Diagnosis not present

## 2020-05-03 DIAGNOSIS — M069 Rheumatoid arthritis, unspecified: Secondary | ICD-10-CM | POA: Diagnosis not present

## 2020-05-03 DIAGNOSIS — M1990 Unspecified osteoarthritis, unspecified site: Secondary | ICD-10-CM | POA: Diagnosis not present

## 2020-05-03 DIAGNOSIS — L4050 Arthropathic psoriasis, unspecified: Secondary | ICD-10-CM | POA: Diagnosis not present

## 2020-05-03 DIAGNOSIS — E1151 Type 2 diabetes mellitus with diabetic peripheral angiopathy without gangrene: Secondary | ICD-10-CM | POA: Diagnosis not present

## 2020-05-03 DIAGNOSIS — I10 Essential (primary) hypertension: Secondary | ICD-10-CM | POA: Diagnosis not present

## 2020-05-03 DIAGNOSIS — M80051D Age-related osteoporosis with current pathological fracture, right femur, subsequent encounter for fracture with routine healing: Secondary | ICD-10-CM | POA: Diagnosis not present

## 2020-05-06 DIAGNOSIS — L4050 Arthropathic psoriasis, unspecified: Secondary | ICD-10-CM | POA: Diagnosis not present

## 2020-05-06 DIAGNOSIS — I10 Essential (primary) hypertension: Secondary | ICD-10-CM | POA: Diagnosis not present

## 2020-05-06 DIAGNOSIS — M1990 Unspecified osteoarthritis, unspecified site: Secondary | ICD-10-CM | POA: Diagnosis not present

## 2020-05-06 DIAGNOSIS — M069 Rheumatoid arthritis, unspecified: Secondary | ICD-10-CM | POA: Diagnosis not present

## 2020-05-06 DIAGNOSIS — M80051D Age-related osteoporosis with current pathological fracture, right femur, subsequent encounter for fracture with routine healing: Secondary | ICD-10-CM | POA: Diagnosis not present

## 2020-05-06 DIAGNOSIS — E1151 Type 2 diabetes mellitus with diabetic peripheral angiopathy without gangrene: Secondary | ICD-10-CM | POA: Diagnosis not present

## 2020-05-09 DIAGNOSIS — M80051D Age-related osteoporosis with current pathological fracture, right femur, subsequent encounter for fracture with routine healing: Secondary | ICD-10-CM | POA: Diagnosis not present

## 2020-05-09 DIAGNOSIS — E1151 Type 2 diabetes mellitus with diabetic peripheral angiopathy without gangrene: Secondary | ICD-10-CM | POA: Diagnosis not present

## 2020-05-09 DIAGNOSIS — I10 Essential (primary) hypertension: Secondary | ICD-10-CM | POA: Diagnosis not present

## 2020-05-09 DIAGNOSIS — L4050 Arthropathic psoriasis, unspecified: Secondary | ICD-10-CM | POA: Diagnosis not present

## 2020-05-09 DIAGNOSIS — M069 Rheumatoid arthritis, unspecified: Secondary | ICD-10-CM | POA: Diagnosis not present

## 2020-05-09 DIAGNOSIS — M1990 Unspecified osteoarthritis, unspecified site: Secondary | ICD-10-CM | POA: Diagnosis not present

## 2020-05-10 DIAGNOSIS — M069 Rheumatoid arthritis, unspecified: Secondary | ICD-10-CM | POA: Diagnosis not present

## 2020-05-10 DIAGNOSIS — Z79899 Other long term (current) drug therapy: Secondary | ICD-10-CM | POA: Diagnosis not present

## 2020-05-10 DIAGNOSIS — E1165 Type 2 diabetes mellitus with hyperglycemia: Secondary | ICD-10-CM | POA: Diagnosis not present

## 2020-05-10 DIAGNOSIS — L4050 Arthropathic psoriasis, unspecified: Secondary | ICD-10-CM | POA: Diagnosis not present

## 2020-05-10 DIAGNOSIS — Z299 Encounter for prophylactic measures, unspecified: Secondary | ICD-10-CM | POA: Diagnosis not present

## 2020-06-06 DIAGNOSIS — R54 Age-related physical debility: Secondary | ICD-10-CM | POA: Diagnosis not present

## 2020-06-06 DIAGNOSIS — M0579 Rheumatoid arthritis with rheumatoid factor of multiple sites without organ or systems involvement: Secondary | ICD-10-CM | POA: Diagnosis not present

## 2020-06-06 DIAGNOSIS — I739 Peripheral vascular disease, unspecified: Secondary | ICD-10-CM | POA: Diagnosis not present

## 2020-06-06 DIAGNOSIS — L97912 Non-pressure chronic ulcer of unspecified part of right lower leg with fat layer exposed: Secondary | ICD-10-CM | POA: Diagnosis not present

## 2020-06-06 DIAGNOSIS — L4052 Psoriatic arthritis mutilans: Secondary | ICD-10-CM | POA: Diagnosis not present

## 2020-06-06 DIAGNOSIS — L97922 Non-pressure chronic ulcer of unspecified part of left lower leg with fat layer exposed: Secondary | ICD-10-CM | POA: Diagnosis not present

## 2020-06-06 DIAGNOSIS — Z993 Dependence on wheelchair: Secondary | ICD-10-CM | POA: Diagnosis not present

## 2020-06-06 DIAGNOSIS — F411 Generalized anxiety disorder: Secondary | ICD-10-CM | POA: Diagnosis not present

## 2020-06-09 DIAGNOSIS — M069 Rheumatoid arthritis, unspecified: Secondary | ICD-10-CM | POA: Diagnosis not present

## 2020-06-09 DIAGNOSIS — I10 Essential (primary) hypertension: Secondary | ICD-10-CM | POA: Diagnosis not present

## 2020-06-09 DIAGNOSIS — Z299 Encounter for prophylactic measures, unspecified: Secondary | ICD-10-CM | POA: Diagnosis not present

## 2020-06-09 DIAGNOSIS — E1165 Type 2 diabetes mellitus with hyperglycemia: Secondary | ICD-10-CM | POA: Diagnosis not present

## 2020-06-09 DIAGNOSIS — R600 Localized edema: Secondary | ICD-10-CM | POA: Diagnosis not present

## 2020-06-14 DIAGNOSIS — E1165 Type 2 diabetes mellitus with hyperglycemia: Secondary | ICD-10-CM | POA: Diagnosis not present

## 2020-06-14 DIAGNOSIS — I10 Essential (primary) hypertension: Secondary | ICD-10-CM | POA: Diagnosis not present

## 2020-06-14 DIAGNOSIS — Z299 Encounter for prophylactic measures, unspecified: Secondary | ICD-10-CM | POA: Diagnosis not present

## 2020-06-14 DIAGNOSIS — J449 Chronic obstructive pulmonary disease, unspecified: Secondary | ICD-10-CM | POA: Diagnosis not present

## 2020-06-14 DIAGNOSIS — F419 Anxiety disorder, unspecified: Secondary | ICD-10-CM | POA: Diagnosis not present

## 2020-06-18 DIAGNOSIS — L97829 Non-pressure chronic ulcer of other part of left lower leg with unspecified severity: Secondary | ICD-10-CM | POA: Diagnosis not present

## 2020-06-18 DIAGNOSIS — E1165 Type 2 diabetes mellitus with hyperglycemia: Secondary | ICD-10-CM | POA: Diagnosis not present

## 2020-06-18 DIAGNOSIS — I872 Venous insufficiency (chronic) (peripheral): Secondary | ICD-10-CM | POA: Diagnosis not present

## 2020-06-18 DIAGNOSIS — K5900 Constipation, unspecified: Secondary | ICD-10-CM | POA: Diagnosis not present

## 2020-06-18 DIAGNOSIS — F40240 Claustrophobia: Secondary | ICD-10-CM | POA: Diagnosis not present

## 2020-06-18 DIAGNOSIS — L4052 Psoriatic arthritis mutilans: Secondary | ICD-10-CM | POA: Diagnosis not present

## 2020-06-18 DIAGNOSIS — J40 Bronchitis, not specified as acute or chronic: Secondary | ICD-10-CM | POA: Diagnosis not present

## 2020-06-18 DIAGNOSIS — R1310 Dysphagia, unspecified: Secondary | ICD-10-CM | POA: Diagnosis not present

## 2020-06-18 DIAGNOSIS — J45909 Unspecified asthma, uncomplicated: Secondary | ICD-10-CM | POA: Diagnosis not present

## 2020-06-18 DIAGNOSIS — E785 Hyperlipidemia, unspecified: Secondary | ICD-10-CM | POA: Diagnosis not present

## 2020-06-18 DIAGNOSIS — M810 Age-related osteoporosis without current pathological fracture: Secondary | ICD-10-CM | POA: Diagnosis not present

## 2020-06-18 DIAGNOSIS — Z4800 Encounter for change or removal of nonsurgical wound dressing: Secondary | ICD-10-CM | POA: Diagnosis not present

## 2020-06-18 DIAGNOSIS — D638 Anemia in other chronic diseases classified elsewhere: Secondary | ICD-10-CM | POA: Diagnosis not present

## 2020-06-18 DIAGNOSIS — L97819 Non-pressure chronic ulcer of other part of right lower leg with unspecified severity: Secondary | ICD-10-CM | POA: Diagnosis not present

## 2020-06-18 DIAGNOSIS — E559 Vitamin D deficiency, unspecified: Secondary | ICD-10-CM | POA: Diagnosis not present

## 2020-06-18 DIAGNOSIS — F411 Generalized anxiety disorder: Secondary | ICD-10-CM | POA: Diagnosis not present

## 2020-06-18 DIAGNOSIS — F32A Depression, unspecified: Secondary | ICD-10-CM | POA: Diagnosis not present

## 2020-06-18 DIAGNOSIS — E1151 Type 2 diabetes mellitus with diabetic peripheral angiopathy without gangrene: Secondary | ICD-10-CM | POA: Diagnosis not present

## 2020-06-18 DIAGNOSIS — Z87440 Personal history of urinary (tract) infections: Secondary | ICD-10-CM | POA: Diagnosis not present

## 2020-06-18 DIAGNOSIS — L03115 Cellulitis of right lower limb: Secondary | ICD-10-CM | POA: Diagnosis not present

## 2020-06-18 DIAGNOSIS — I10 Essential (primary) hypertension: Secondary | ICD-10-CM | POA: Diagnosis not present

## 2020-06-18 DIAGNOSIS — M0579 Rheumatoid arthritis with rheumatoid factor of multiple sites without organ or systems involvement: Secondary | ICD-10-CM | POA: Diagnosis not present

## 2020-06-18 DIAGNOSIS — L03116 Cellulitis of left lower limb: Secondary | ICD-10-CM | POA: Diagnosis not present

## 2020-06-18 DIAGNOSIS — Z87310 Personal history of (healed) osteoporosis fracture: Secondary | ICD-10-CM | POA: Diagnosis not present

## 2020-06-18 DIAGNOSIS — I6529 Occlusion and stenosis of unspecified carotid artery: Secondary | ICD-10-CM | POA: Diagnosis not present

## 2020-06-20 DIAGNOSIS — Z993 Dependence on wheelchair: Secondary | ICD-10-CM | POA: Diagnosis not present

## 2020-06-20 DIAGNOSIS — M0579 Rheumatoid arthritis with rheumatoid factor of multiple sites without organ or systems involvement: Secondary | ICD-10-CM | POA: Diagnosis not present

## 2020-06-20 DIAGNOSIS — R54 Age-related physical debility: Secondary | ICD-10-CM | POA: Diagnosis not present

## 2020-06-20 DIAGNOSIS — I739 Peripheral vascular disease, unspecified: Secondary | ICD-10-CM | POA: Diagnosis not present

## 2020-06-20 DIAGNOSIS — L97912 Non-pressure chronic ulcer of unspecified part of right lower leg with fat layer exposed: Secondary | ICD-10-CM | POA: Diagnosis not present

## 2020-06-20 DIAGNOSIS — L97922 Non-pressure chronic ulcer of unspecified part of left lower leg with fat layer exposed: Secondary | ICD-10-CM | POA: Diagnosis not present

## 2020-06-20 DIAGNOSIS — L4052 Psoriatic arthritis mutilans: Secondary | ICD-10-CM | POA: Diagnosis not present

## 2020-06-20 DIAGNOSIS — F411 Generalized anxiety disorder: Secondary | ICD-10-CM | POA: Diagnosis not present

## 2020-06-21 DIAGNOSIS — L03115 Cellulitis of right lower limb: Secondary | ICD-10-CM | POA: Diagnosis not present

## 2020-06-21 DIAGNOSIS — Z4800 Encounter for change or removal of nonsurgical wound dressing: Secondary | ICD-10-CM | POA: Diagnosis not present

## 2020-06-21 DIAGNOSIS — E1151 Type 2 diabetes mellitus with diabetic peripheral angiopathy without gangrene: Secondary | ICD-10-CM | POA: Diagnosis not present

## 2020-06-21 DIAGNOSIS — L97819 Non-pressure chronic ulcer of other part of right lower leg with unspecified severity: Secondary | ICD-10-CM | POA: Diagnosis not present

## 2020-06-21 DIAGNOSIS — L97829 Non-pressure chronic ulcer of other part of left lower leg with unspecified severity: Secondary | ICD-10-CM | POA: Diagnosis not present

## 2020-06-21 DIAGNOSIS — L03116 Cellulitis of left lower limb: Secondary | ICD-10-CM | POA: Diagnosis not present

## 2020-06-23 DIAGNOSIS — W19XXXA Unspecified fall, initial encounter: Secondary | ICD-10-CM | POA: Diagnosis not present

## 2020-06-23 DIAGNOSIS — R770 Abnormality of albumin: Secondary | ICD-10-CM | POA: Diagnosis not present

## 2020-06-23 DIAGNOSIS — W07XXXA Fall from chair, initial encounter: Secondary | ICD-10-CM | POA: Diagnosis not present

## 2020-06-23 DIAGNOSIS — R531 Weakness: Secondary | ICD-10-CM | POA: Diagnosis not present

## 2020-06-23 DIAGNOSIS — D849 Immunodeficiency, unspecified: Secondary | ICD-10-CM | POA: Diagnosis not present

## 2020-06-23 DIAGNOSIS — S42292A Other displaced fracture of upper end of left humerus, initial encounter for closed fracture: Secondary | ICD-10-CM | POA: Diagnosis not present

## 2020-06-23 DIAGNOSIS — N189 Chronic kidney disease, unspecified: Secondary | ICD-10-CM | POA: Diagnosis not present

## 2020-06-23 DIAGNOSIS — S42212A Unspecified displaced fracture of surgical neck of left humerus, initial encounter for closed fracture: Secondary | ICD-10-CM | POA: Diagnosis not present

## 2020-06-23 DIAGNOSIS — M6281 Muscle weakness (generalized): Secondary | ICD-10-CM | POA: Diagnosis not present

## 2020-06-23 DIAGNOSIS — R0902 Hypoxemia: Secondary | ICD-10-CM | POA: Diagnosis not present

## 2020-06-23 DIAGNOSIS — L97519 Non-pressure chronic ulcer of other part of right foot with unspecified severity: Secondary | ICD-10-CM | POA: Diagnosis not present

## 2020-06-23 DIAGNOSIS — G9341 Metabolic encephalopathy: Secondary | ICD-10-CM | POA: Diagnosis present

## 2020-06-23 DIAGNOSIS — K219 Gastro-esophageal reflux disease without esophagitis: Secondary | ICD-10-CM | POA: Diagnosis not present

## 2020-06-23 DIAGNOSIS — L97929 Non-pressure chronic ulcer of unspecified part of left lower leg with unspecified severity: Secondary | ICD-10-CM | POA: Diagnosis present

## 2020-06-23 DIAGNOSIS — Z7401 Bed confinement status: Secondary | ICD-10-CM | POA: Diagnosis not present

## 2020-06-23 DIAGNOSIS — G9389 Other specified disorders of brain: Secondary | ICD-10-CM | POA: Diagnosis not present

## 2020-06-23 DIAGNOSIS — K7460 Unspecified cirrhosis of liver: Secondary | ICD-10-CM | POA: Diagnosis present

## 2020-06-23 DIAGNOSIS — J811 Chronic pulmonary edema: Secondary | ICD-10-CM | POA: Diagnosis not present

## 2020-06-23 DIAGNOSIS — R1312 Dysphagia, oropharyngeal phase: Secondary | ICD-10-CM | POA: Diagnosis not present

## 2020-06-23 DIAGNOSIS — R000 Tachycardia, unspecified: Secondary | ICD-10-CM | POA: Diagnosis not present

## 2020-06-23 DIAGNOSIS — I129 Hypertensive chronic kidney disease with stage 1 through stage 4 chronic kidney disease, or unspecified chronic kidney disease: Secondary | ICD-10-CM | POA: Diagnosis present

## 2020-06-23 DIAGNOSIS — M069 Rheumatoid arthritis, unspecified: Secondary | ICD-10-CM | POA: Diagnosis present

## 2020-06-23 DIAGNOSIS — S81801D Unspecified open wound, right lower leg, subsequent encounter: Secondary | ICD-10-CM | POA: Diagnosis not present

## 2020-06-23 DIAGNOSIS — S42412D Displaced simple supracondylar fracture without intercondylar fracture of left humerus, subsequent encounter for fracture with routine healing: Secondary | ICD-10-CM | POA: Diagnosis not present

## 2020-06-23 DIAGNOSIS — I959 Hypotension, unspecified: Secondary | ICD-10-CM | POA: Diagnosis not present

## 2020-06-23 DIAGNOSIS — Z882 Allergy status to sulfonamides status: Secondary | ICD-10-CM | POA: Diagnosis not present

## 2020-06-23 DIAGNOSIS — M542 Cervicalgia: Secondary | ICD-10-CM | POA: Diagnosis not present

## 2020-06-23 DIAGNOSIS — M19012 Primary osteoarthritis, left shoulder: Secondary | ICD-10-CM | POA: Diagnosis not present

## 2020-06-23 DIAGNOSIS — R278 Other lack of coordination: Secondary | ICD-10-CM | POA: Diagnosis not present

## 2020-06-23 DIAGNOSIS — I517 Cardiomegaly: Secondary | ICD-10-CM | POA: Diagnosis not present

## 2020-06-23 DIAGNOSIS — Z20822 Contact with and (suspected) exposure to covid-19: Secondary | ICD-10-CM | POA: Diagnosis not present

## 2020-06-23 DIAGNOSIS — E1151 Type 2 diabetes mellitus with diabetic peripheral angiopathy without gangrene: Secondary | ICD-10-CM | POA: Diagnosis present

## 2020-06-23 DIAGNOSIS — S81802D Unspecified open wound, left lower leg, subsequent encounter: Secondary | ICD-10-CM | POA: Diagnosis not present

## 2020-06-23 DIAGNOSIS — I4891 Unspecified atrial fibrillation: Secondary | ICD-10-CM | POA: Diagnosis not present

## 2020-06-23 DIAGNOSIS — Z7952 Long term (current) use of systemic steroids: Secondary | ICD-10-CM | POA: Diagnosis not present

## 2020-06-23 DIAGNOSIS — S81802A Unspecified open wound, left lower leg, initial encounter: Secondary | ICD-10-CM | POA: Diagnosis not present

## 2020-06-23 DIAGNOSIS — I10 Essential (primary) hypertension: Secondary | ICD-10-CM | POA: Diagnosis not present

## 2020-06-23 DIAGNOSIS — S81801A Unspecified open wound, right lower leg, initial encounter: Secondary | ICD-10-CM | POA: Diagnosis not present

## 2020-06-23 DIAGNOSIS — R262 Difficulty in walking, not elsewhere classified: Secondary | ICD-10-CM | POA: Diagnosis not present

## 2020-06-23 DIAGNOSIS — L97529 Non-pressure chronic ulcer of other part of left foot with unspecified severity: Secondary | ICD-10-CM | POA: Diagnosis not present

## 2020-06-23 DIAGNOSIS — I700 Atherosclerosis of aorta: Secondary | ICD-10-CM | POA: Diagnosis not present

## 2020-06-23 DIAGNOSIS — E1122 Type 2 diabetes mellitus with diabetic chronic kidney disease: Secondary | ICD-10-CM | POA: Diagnosis present

## 2020-06-23 DIAGNOSIS — R41841 Cognitive communication deficit: Secondary | ICD-10-CM | POA: Diagnosis not present

## 2020-06-23 DIAGNOSIS — S42252A Displaced fracture of greater tuberosity of left humerus, initial encounter for closed fracture: Secondary | ICD-10-CM | POA: Diagnosis not present

## 2020-06-23 DIAGNOSIS — R0689 Other abnormalities of breathing: Secondary | ICD-10-CM | POA: Diagnosis not present

## 2020-06-23 DIAGNOSIS — I739 Peripheral vascular disease, unspecified: Secondary | ICD-10-CM | POA: Diagnosis not present

## 2020-06-23 DIAGNOSIS — R5381 Other malaise: Secondary | ICD-10-CM | POA: Diagnosis not present

## 2020-06-23 DIAGNOSIS — G319 Degenerative disease of nervous system, unspecified: Secondary | ICD-10-CM | POA: Diagnosis not present

## 2020-06-23 DIAGNOSIS — E8809 Other disorders of plasma-protein metabolism, not elsewhere classified: Secondary | ICD-10-CM | POA: Diagnosis present

## 2020-06-23 DIAGNOSIS — R600 Localized edema: Secondary | ICD-10-CM | POA: Diagnosis not present

## 2020-06-23 DIAGNOSIS — U071 COVID-19: Secondary | ICD-10-CM | POA: Diagnosis not present

## 2020-06-23 DIAGNOSIS — S42415D Nondisplaced simple supracondylar fracture without intercondylar fracture of left humerus, subsequent encounter for fracture with routine healing: Secondary | ICD-10-CM | POA: Diagnosis not present

## 2020-06-23 DIAGNOSIS — E441 Mild protein-calorie malnutrition: Secondary | ICD-10-CM | POA: Diagnosis not present

## 2020-06-23 DIAGNOSIS — Z66 Do not resuscitate: Secondary | ICD-10-CM | POA: Diagnosis present

## 2020-06-23 DIAGNOSIS — R627 Adult failure to thrive: Secondary | ICD-10-CM | POA: Diagnosis not present

## 2020-06-23 DIAGNOSIS — M810 Age-related osteoporosis without current pathological fracture: Secondary | ICD-10-CM | POA: Diagnosis not present

## 2020-06-23 DIAGNOSIS — R404 Transient alteration of awareness: Secondary | ICD-10-CM | POA: Diagnosis not present

## 2020-06-23 DIAGNOSIS — E860 Dehydration: Secondary | ICD-10-CM | POA: Diagnosis not present

## 2020-06-23 DIAGNOSIS — R410 Disorientation, unspecified: Secondary | ICD-10-CM | POA: Diagnosis not present

## 2020-06-23 DIAGNOSIS — Z7902 Long term (current) use of antithrombotics/antiplatelets: Secondary | ICD-10-CM | POA: Diagnosis not present

## 2020-06-23 DIAGNOSIS — D631 Anemia in chronic kidney disease: Secondary | ICD-10-CM | POA: Diagnosis not present

## 2020-06-23 DIAGNOSIS — L97919 Non-pressure chronic ulcer of unspecified part of right lower leg with unspecified severity: Secondary | ICD-10-CM | POA: Diagnosis present

## 2020-06-23 DIAGNOSIS — S42202A Unspecified fracture of upper end of left humerus, initial encounter for closed fracture: Secondary | ICD-10-CM | POA: Diagnosis not present

## 2020-06-23 DIAGNOSIS — D649 Anemia, unspecified: Secondary | ICD-10-CM | POA: Diagnosis not present

## 2020-06-23 DIAGNOSIS — Z880 Allergy status to penicillin: Secondary | ICD-10-CM | POA: Diagnosis not present

## 2020-06-23 DIAGNOSIS — S42422A Displaced comminuted supracondylar fracture without intercondylar fracture of left humerus, initial encounter for closed fracture: Secondary | ICD-10-CM | POA: Diagnosis present

## 2020-06-23 DIAGNOSIS — M25512 Pain in left shoulder: Secondary | ICD-10-CM | POA: Diagnosis not present

## 2020-06-23 DIAGNOSIS — S42412A Displaced simple supracondylar fracture without intercondylar fracture of left humerus, initial encounter for closed fracture: Secondary | ICD-10-CM | POA: Diagnosis not present

## 2020-06-23 DIAGNOSIS — Z9181 History of falling: Secondary | ICD-10-CM | POA: Diagnosis not present

## 2020-06-23 DIAGNOSIS — N183 Chronic kidney disease, stage 3 unspecified: Secondary | ICD-10-CM | POA: Diagnosis present

## 2020-06-23 DIAGNOSIS — M25519 Pain in unspecified shoulder: Secondary | ICD-10-CM | POA: Diagnosis not present

## 2020-06-23 DIAGNOSIS — M2578 Osteophyte, vertebrae: Secondary | ICD-10-CM | POA: Diagnosis not present

## 2020-06-23 DIAGNOSIS — Z7982 Long term (current) use of aspirin: Secondary | ICD-10-CM | POA: Diagnosis not present

## 2020-06-23 DIAGNOSIS — Z79810 Long term (current) use of selective estrogen receptor modulators (SERMs): Secondary | ICD-10-CM | POA: Diagnosis not present

## 2020-06-23 DIAGNOSIS — B342 Coronavirus infection, unspecified: Secondary | ICD-10-CM | POA: Diagnosis present

## 2020-06-23 DIAGNOSIS — R059 Cough, unspecified: Secondary | ICD-10-CM | POA: Diagnosis not present

## 2020-06-23 DIAGNOSIS — M47812 Spondylosis without myelopathy or radiculopathy, cervical region: Secondary | ICD-10-CM | POA: Diagnosis not present

## 2020-06-23 DIAGNOSIS — Z8616 Personal history of COVID-19: Secondary | ICD-10-CM | POA: Diagnosis not present

## 2020-07-06 DIAGNOSIS — M0579 Rheumatoid arthritis with rheumatoid factor of multiple sites without organ or systems involvement: Secondary | ICD-10-CM | POA: Diagnosis not present

## 2020-07-06 DIAGNOSIS — R531 Weakness: Secondary | ICD-10-CM | POA: Diagnosis not present

## 2020-07-06 DIAGNOSIS — Z993 Dependence on wheelchair: Secondary | ICD-10-CM | POA: Diagnosis not present

## 2020-07-06 DIAGNOSIS — L97912 Non-pressure chronic ulcer of unspecified part of right lower leg with fat layer exposed: Secondary | ICD-10-CM | POA: Diagnosis not present

## 2020-07-06 DIAGNOSIS — R1312 Dysphagia, oropharyngeal phase: Secondary | ICD-10-CM | POA: Diagnosis not present

## 2020-07-06 DIAGNOSIS — R41841 Cognitive communication deficit: Secondary | ICD-10-CM | POA: Diagnosis not present

## 2020-07-06 DIAGNOSIS — R770 Abnormality of albumin: Secondary | ICD-10-CM | POA: Diagnosis not present

## 2020-07-06 DIAGNOSIS — D849 Immunodeficiency, unspecified: Secondary | ICD-10-CM | POA: Diagnosis not present

## 2020-07-06 DIAGNOSIS — G9341 Metabolic encephalopathy: Secondary | ICD-10-CM | POA: Diagnosis not present

## 2020-07-06 DIAGNOSIS — Z7401 Bed confinement status: Secondary | ICD-10-CM | POA: Diagnosis not present

## 2020-07-06 DIAGNOSIS — F321 Major depressive disorder, single episode, moderate: Secondary | ICD-10-CM | POA: Diagnosis not present

## 2020-07-06 DIAGNOSIS — S42413D Displaced simple supracondylar fracture without intercondylar fracture of unspecified humerus, subsequent encounter for fracture with routine healing: Secondary | ICD-10-CM | POA: Diagnosis not present

## 2020-07-06 DIAGNOSIS — I959 Hypotension, unspecified: Secondary | ICD-10-CM | POA: Diagnosis not present

## 2020-07-06 DIAGNOSIS — G4720 Circadian rhythm sleep disorder, unspecified type: Secondary | ICD-10-CM | POA: Diagnosis not present

## 2020-07-06 DIAGNOSIS — L97909 Non-pressure chronic ulcer of unspecified part of unspecified lower leg with unspecified severity: Secondary | ICD-10-CM | POA: Diagnosis not present

## 2020-07-06 DIAGNOSIS — Z9181 History of falling: Secondary | ICD-10-CM | POA: Diagnosis not present

## 2020-07-06 DIAGNOSIS — S42202A Unspecified fracture of upper end of left humerus, initial encounter for closed fracture: Secondary | ICD-10-CM | POA: Diagnosis not present

## 2020-07-06 DIAGNOSIS — E441 Mild protein-calorie malnutrition: Secondary | ICD-10-CM | POA: Diagnosis not present

## 2020-07-06 DIAGNOSIS — S42413A Displaced simple supracondylar fracture without intercondylar fracture of unspecified humerus, initial encounter for closed fracture: Secondary | ICD-10-CM | POA: Diagnosis not present

## 2020-07-06 DIAGNOSIS — L97922 Non-pressure chronic ulcer of unspecified part of left lower leg with fat layer exposed: Secondary | ICD-10-CM | POA: Diagnosis not present

## 2020-07-06 DIAGNOSIS — R262 Difficulty in walking, not elsewhere classified: Secondary | ICD-10-CM | POA: Diagnosis not present

## 2020-07-06 DIAGNOSIS — G8929 Other chronic pain: Secondary | ICD-10-CM | POA: Diagnosis not present

## 2020-07-06 DIAGNOSIS — M069 Rheumatoid arthritis, unspecified: Secondary | ICD-10-CM | POA: Diagnosis not present

## 2020-07-06 DIAGNOSIS — M6281 Muscle weakness (generalized): Secondary | ICD-10-CM | POA: Diagnosis not present

## 2020-07-06 DIAGNOSIS — R600 Localized edema: Secondary | ICD-10-CM | POA: Diagnosis not present

## 2020-07-06 DIAGNOSIS — L4052 Psoriatic arthritis mutilans: Secondary | ICD-10-CM | POA: Diagnosis not present

## 2020-07-06 DIAGNOSIS — D649 Anemia, unspecified: Secondary | ICD-10-CM | POA: Diagnosis not present

## 2020-07-06 DIAGNOSIS — R278 Other lack of coordination: Secondary | ICD-10-CM | POA: Diagnosis not present

## 2020-07-06 DIAGNOSIS — M810 Age-related osteoporosis without current pathological fracture: Secondary | ICD-10-CM | POA: Diagnosis not present

## 2020-07-06 DIAGNOSIS — K219 Gastro-esophageal reflux disease without esophagitis: Secondary | ICD-10-CM | POA: Diagnosis not present

## 2020-07-06 DIAGNOSIS — S42415D Nondisplaced simple supracondylar fracture without intercondylar fracture of left humerus, subsequent encounter for fracture with routine healing: Secondary | ICD-10-CM | POA: Diagnosis not present

## 2020-07-06 DIAGNOSIS — Z8616 Personal history of COVID-19: Secondary | ICD-10-CM | POA: Diagnosis not present

## 2020-07-06 DIAGNOSIS — I739 Peripheral vascular disease, unspecified: Secondary | ICD-10-CM | POA: Diagnosis not present

## 2020-07-06 DIAGNOSIS — K7460 Unspecified cirrhosis of liver: Secondary | ICD-10-CM | POA: Diagnosis not present

## 2020-07-06 DIAGNOSIS — W19XXXA Unspecified fall, initial encounter: Secondary | ICD-10-CM | POA: Diagnosis not present

## 2020-07-06 DIAGNOSIS — R5381 Other malaise: Secondary | ICD-10-CM | POA: Diagnosis not present

## 2020-07-06 DIAGNOSIS — N189 Chronic kidney disease, unspecified: Secondary | ICD-10-CM | POA: Diagnosis not present

## 2020-07-06 DIAGNOSIS — R54 Age-related physical debility: Secondary | ICD-10-CM | POA: Diagnosis not present

## 2020-07-06 DIAGNOSIS — F411 Generalized anxiety disorder: Secondary | ICD-10-CM | POA: Diagnosis not present

## 2020-07-06 DIAGNOSIS — E860 Dehydration: Secondary | ICD-10-CM | POA: Diagnosis not present

## 2020-07-07 DIAGNOSIS — K219 Gastro-esophageal reflux disease without esophagitis: Secondary | ICD-10-CM | POA: Diagnosis not present

## 2020-07-07 DIAGNOSIS — R600 Localized edema: Secondary | ICD-10-CM | POA: Diagnosis not present

## 2020-07-07 DIAGNOSIS — L97909 Non-pressure chronic ulcer of unspecified part of unspecified lower leg with unspecified severity: Secondary | ICD-10-CM | POA: Diagnosis not present

## 2020-07-07 DIAGNOSIS — D649 Anemia, unspecified: Secondary | ICD-10-CM | POA: Diagnosis not present

## 2020-07-07 DIAGNOSIS — I739 Peripheral vascular disease, unspecified: Secondary | ICD-10-CM | POA: Diagnosis not present

## 2020-07-07 DIAGNOSIS — S42413A Displaced simple supracondylar fracture without intercondylar fracture of unspecified humerus, initial encounter for closed fracture: Secondary | ICD-10-CM | POA: Diagnosis not present

## 2020-07-07 DIAGNOSIS — M069 Rheumatoid arthritis, unspecified: Secondary | ICD-10-CM | POA: Diagnosis not present

## 2020-07-07 DIAGNOSIS — N189 Chronic kidney disease, unspecified: Secondary | ICD-10-CM | POA: Diagnosis not present

## 2020-07-15 DIAGNOSIS — L97912 Non-pressure chronic ulcer of unspecified part of right lower leg with fat layer exposed: Secondary | ICD-10-CM | POA: Diagnosis not present

## 2020-07-15 DIAGNOSIS — Z993 Dependence on wheelchair: Secondary | ICD-10-CM | POA: Diagnosis not present

## 2020-07-15 DIAGNOSIS — I739 Peripheral vascular disease, unspecified: Secondary | ICD-10-CM | POA: Diagnosis not present

## 2020-07-15 DIAGNOSIS — L4052 Psoriatic arthritis mutilans: Secondary | ICD-10-CM | POA: Diagnosis not present

## 2020-07-15 DIAGNOSIS — R54 Age-related physical debility: Secondary | ICD-10-CM | POA: Diagnosis not present

## 2020-07-15 DIAGNOSIS — F411 Generalized anxiety disorder: Secondary | ICD-10-CM | POA: Diagnosis not present

## 2020-07-15 DIAGNOSIS — M0579 Rheumatoid arthritis with rheumatoid factor of multiple sites without organ or systems involvement: Secondary | ICD-10-CM | POA: Diagnosis not present

## 2020-07-15 DIAGNOSIS — L97922 Non-pressure chronic ulcer of unspecified part of left lower leg with fat layer exposed: Secondary | ICD-10-CM | POA: Diagnosis not present

## 2020-07-28 ENCOUNTER — Other Ambulatory Visit: Payer: Self-pay | Admitting: *Deleted

## 2020-07-28 DIAGNOSIS — G4720 Circadian rhythm sleep disorder, unspecified type: Secondary | ICD-10-CM | POA: Diagnosis not present

## 2020-07-28 DIAGNOSIS — F321 Major depressive disorder, single episode, moderate: Secondary | ICD-10-CM | POA: Diagnosis not present

## 2020-07-28 DIAGNOSIS — F411 Generalized anxiety disorder: Secondary | ICD-10-CM | POA: Diagnosis not present

## 2020-07-28 DIAGNOSIS — G8929 Other chronic pain: Secondary | ICD-10-CM | POA: Diagnosis not present

## 2020-07-28 NOTE — Patient Outreach (Signed)
[facility] Post- Acute Care Coordinator follow up. Member screened for potential [facility] Care Management needs.   Update received from [facility] SW indicating transition plan is to return home with spouse. Awaiting CAP services to do an assessment to reinstate her services.   Will continue to follow while member resides in SNF. Will plan outreach as appropriate to discuss [place] Management services.    Marthenia Rolling, MSN, RN,BSN [place] Acute Care Coordinator [phone] [facility]) [phone]  (Toll free office)

## 2020-07-28 NOTE — Patient Outreach (Signed)
Member screened for potential [facility] Care Management needs.   According to [facility] (Patient [name]) member resides in [facility].   Communication sent to facility SW to inquire about transition plans.  Will continue to follow.   Marthenia Rolling, MSN, RN,BSN [place] Acute Care Coordinator [phone] [facility]) [phone]  (Toll free office)

## 2020-08-16 DIAGNOSIS — L97909 Non-pressure chronic ulcer of unspecified part of unspecified lower leg with unspecified severity: Secondary | ICD-10-CM | POA: Diagnosis not present

## 2020-08-16 DIAGNOSIS — L97912 Non-pressure chronic ulcer of unspecified part of right lower leg with fat layer exposed: Secondary | ICD-10-CM | POA: Diagnosis not present

## 2020-08-16 DIAGNOSIS — M0579 Rheumatoid arthritis with rheumatoid factor of multiple sites without organ or systems involvement: Secondary | ICD-10-CM | POA: Diagnosis not present

## 2020-08-16 DIAGNOSIS — L4052 Psoriatic arthritis mutilans: Secondary | ICD-10-CM | POA: Diagnosis not present

## 2020-08-16 DIAGNOSIS — L97922 Non-pressure chronic ulcer of unspecified part of left lower leg with fat layer exposed: Secondary | ICD-10-CM | POA: Diagnosis not present

## 2020-08-16 DIAGNOSIS — F411 Generalized anxiety disorder: Secondary | ICD-10-CM | POA: Diagnosis not present

## 2020-08-16 DIAGNOSIS — R54 Age-related physical debility: Secondary | ICD-10-CM | POA: Diagnosis not present

## 2020-08-16 DIAGNOSIS — Z993 Dependence on wheelchair: Secondary | ICD-10-CM | POA: Diagnosis not present

## 2020-08-16 DIAGNOSIS — S42413D Displaced simple supracondylar fracture without intercondylar fracture of unspecified humerus, subsequent encounter for fracture with routine healing: Secondary | ICD-10-CM | POA: Diagnosis not present

## 2020-08-16 DIAGNOSIS — I739 Peripheral vascular disease, unspecified: Secondary | ICD-10-CM | POA: Diagnosis not present

## 2020-08-16 DIAGNOSIS — N189 Chronic kidney disease, unspecified: Secondary | ICD-10-CM | POA: Diagnosis not present

## 2020-08-30 DIAGNOSIS — Z299 Encounter for prophylactic measures, unspecified: Secondary | ICD-10-CM | POA: Diagnosis not present

## 2020-08-30 DIAGNOSIS — I10 Essential (primary) hypertension: Secondary | ICD-10-CM | POA: Diagnosis not present

## 2020-08-30 DIAGNOSIS — L4050 Arthropathic psoriasis, unspecified: Secondary | ICD-10-CM | POA: Diagnosis not present

## 2020-08-30 DIAGNOSIS — Z09 Encounter for follow-up examination after completed treatment for conditions other than malignant neoplasm: Secondary | ICD-10-CM | POA: Diagnosis not present

## 2020-08-30 DIAGNOSIS — J449 Chronic obstructive pulmonary disease, unspecified: Secondary | ICD-10-CM | POA: Diagnosis not present

## 2020-08-30 DIAGNOSIS — E1165 Type 2 diabetes mellitus with hyperglycemia: Secondary | ICD-10-CM | POA: Diagnosis not present

## 2020-09-05 DIAGNOSIS — Z20822 Contact with and (suspected) exposure to covid-19: Secondary | ICD-10-CM | POA: Diagnosis not present

## 2020-09-12 DIAGNOSIS — I739 Peripheral vascular disease, unspecified: Secondary | ICD-10-CM | POA: Diagnosis not present

## 2020-09-12 DIAGNOSIS — Z7189 Other specified counseling: Secondary | ICD-10-CM | POA: Diagnosis not present

## 2020-09-12 DIAGNOSIS — E1165 Type 2 diabetes mellitus with hyperglycemia: Secondary | ICD-10-CM | POA: Diagnosis not present

## 2020-09-12 DIAGNOSIS — Z1339 Encounter for screening examination for other mental health and behavioral disorders: Secondary | ICD-10-CM | POA: Diagnosis not present

## 2020-09-12 DIAGNOSIS — Z0000 Encounter for general adult medical examination without abnormal findings: Secondary | ICD-10-CM | POA: Diagnosis not present

## 2020-09-12 DIAGNOSIS — Z299 Encounter for prophylactic measures, unspecified: Secondary | ICD-10-CM | POA: Diagnosis not present

## 2020-09-12 DIAGNOSIS — I10 Essential (primary) hypertension: Secondary | ICD-10-CM | POA: Diagnosis not present

## 2020-09-12 DIAGNOSIS — Z1331 Encounter for screening for depression: Secondary | ICD-10-CM | POA: Diagnosis not present

## 2020-09-12 DIAGNOSIS — R5383 Other fatigue: Secondary | ICD-10-CM | POA: Diagnosis not present

## 2020-09-12 DIAGNOSIS — Z6831 Body mass index (BMI) 31.0-31.9, adult: Secondary | ICD-10-CM | POA: Diagnosis not present

## 2020-09-12 DIAGNOSIS — E7800 Pure hypercholesterolemia, unspecified: Secondary | ICD-10-CM | POA: Diagnosis not present

## 2020-09-12 DIAGNOSIS — Z79899 Other long term (current) drug therapy: Secondary | ICD-10-CM | POA: Diagnosis not present

## 2020-09-14 DIAGNOSIS — Z4800 Encounter for change or removal of nonsurgical wound dressing: Secondary | ICD-10-CM | POA: Diagnosis not present

## 2020-09-19 DIAGNOSIS — I739 Peripheral vascular disease, unspecified: Secondary | ICD-10-CM | POA: Diagnosis not present

## 2020-09-19 DIAGNOSIS — R54 Age-related physical debility: Secondary | ICD-10-CM | POA: Diagnosis not present

## 2020-09-19 DIAGNOSIS — L97912 Non-pressure chronic ulcer of unspecified part of right lower leg with fat layer exposed: Secondary | ICD-10-CM | POA: Diagnosis not present

## 2020-09-19 DIAGNOSIS — Z993 Dependence on wheelchair: Secondary | ICD-10-CM | POA: Diagnosis not present

## 2020-09-19 DIAGNOSIS — L4052 Psoriatic arthritis mutilans: Secondary | ICD-10-CM | POA: Diagnosis not present

## 2020-09-19 DIAGNOSIS — M0579 Rheumatoid arthritis with rheumatoid factor of multiple sites without organ or systems involvement: Secondary | ICD-10-CM | POA: Diagnosis not present

## 2020-09-19 DIAGNOSIS — F411 Generalized anxiety disorder: Secondary | ICD-10-CM | POA: Diagnosis not present

## 2020-09-19 DIAGNOSIS — L97922 Non-pressure chronic ulcer of unspecified part of left lower leg with fat layer exposed: Secondary | ICD-10-CM | POA: Diagnosis not present

## 2020-10-03 DIAGNOSIS — L97909 Non-pressure chronic ulcer of unspecified part of unspecified lower leg with unspecified severity: Secondary | ICD-10-CM | POA: Diagnosis not present

## 2020-10-03 DIAGNOSIS — R0602 Shortness of breath: Secondary | ICD-10-CM | POA: Diagnosis not present

## 2020-10-03 DIAGNOSIS — Z299 Encounter for prophylactic measures, unspecified: Secondary | ICD-10-CM | POA: Diagnosis not present

## 2020-10-12 DIAGNOSIS — Z6831 Body mass index (BMI) 31.0-31.9, adult: Secondary | ICD-10-CM | POA: Diagnosis not present

## 2020-10-12 DIAGNOSIS — J449 Chronic obstructive pulmonary disease, unspecified: Secondary | ICD-10-CM | POA: Diagnosis not present

## 2020-10-12 DIAGNOSIS — Z299 Encounter for prophylactic measures, unspecified: Secondary | ICD-10-CM | POA: Diagnosis not present

## 2020-10-12 DIAGNOSIS — I10 Essential (primary) hypertension: Secondary | ICD-10-CM | POA: Diagnosis not present

## 2020-10-12 DIAGNOSIS — Z789 Other specified health status: Secondary | ICD-10-CM | POA: Diagnosis not present

## 2020-10-12 DIAGNOSIS — K7460 Unspecified cirrhosis of liver: Secondary | ICD-10-CM | POA: Diagnosis not present

## 2020-10-13 DIAGNOSIS — R0902 Hypoxemia: Secondary | ICD-10-CM | POA: Diagnosis not present

## 2020-10-13 DIAGNOSIS — J80 Acute respiratory distress syndrome: Secondary | ICD-10-CM | POA: Diagnosis not present

## 2020-10-13 DIAGNOSIS — R0602 Shortness of breath: Secondary | ICD-10-CM | POA: Diagnosis not present

## 2020-10-13 DIAGNOSIS — R0689 Other abnormalities of breathing: Secondary | ICD-10-CM | POA: Diagnosis not present

## 2020-10-14 DIAGNOSIS — R0602 Shortness of breath: Secondary | ICD-10-CM | POA: Diagnosis not present

## 2020-10-14 DIAGNOSIS — I5030 Unspecified diastolic (congestive) heart failure: Secondary | ICD-10-CM | POA: Diagnosis not present

## 2020-10-14 DIAGNOSIS — I10 Essential (primary) hypertension: Secondary | ICD-10-CM | POA: Diagnosis not present

## 2020-10-14 DIAGNOSIS — Z299 Encounter for prophylactic measures, unspecified: Secondary | ICD-10-CM | POA: Diagnosis not present

## 2020-10-25 DIAGNOSIS — L4050 Arthropathic psoriasis, unspecified: Secondary | ICD-10-CM | POA: Diagnosis not present

## 2020-10-25 DIAGNOSIS — I5030 Unspecified diastolic (congestive) heart failure: Secondary | ICD-10-CM | POA: Diagnosis not present

## 2020-10-25 DIAGNOSIS — M069 Rheumatoid arthritis, unspecified: Secondary | ICD-10-CM | POA: Diagnosis not present

## 2020-10-25 DIAGNOSIS — E1165 Type 2 diabetes mellitus with hyperglycemia: Secondary | ICD-10-CM | POA: Diagnosis not present

## 2020-10-25 DIAGNOSIS — Z299 Encounter for prophylactic measures, unspecified: Secondary | ICD-10-CM | POA: Diagnosis not present

## 2020-11-03 DIAGNOSIS — R0602 Shortness of breath: Secondary | ICD-10-CM | POA: Diagnosis not present

## 2020-11-03 DIAGNOSIS — I272 Pulmonary hypertension, unspecified: Secondary | ICD-10-CM | POA: Diagnosis not present

## 2020-11-03 DIAGNOSIS — I081 Rheumatic disorders of both mitral and tricuspid valves: Secondary | ICD-10-CM | POA: Diagnosis not present

## 2020-11-03 DIAGNOSIS — I5030 Unspecified diastolic (congestive) heart failure: Secondary | ICD-10-CM | POA: Diagnosis not present

## 2020-11-04 DIAGNOSIS — E785 Hyperlipidemia, unspecified: Secondary | ICD-10-CM | POA: Diagnosis not present

## 2020-11-04 DIAGNOSIS — E559 Vitamin D deficiency, unspecified: Secondary | ICD-10-CM | POA: Diagnosis not present

## 2020-11-04 DIAGNOSIS — M810 Age-related osteoporosis without current pathological fracture: Secondary | ICD-10-CM | POA: Diagnosis not present

## 2020-11-04 DIAGNOSIS — E43 Unspecified severe protein-calorie malnutrition: Secondary | ICD-10-CM | POA: Diagnosis not present

## 2020-11-04 DIAGNOSIS — I739 Peripheral vascular disease, unspecified: Secondary | ICD-10-CM | POA: Diagnosis not present

## 2020-11-04 DIAGNOSIS — R0602 Shortness of breath: Secondary | ICD-10-CM | POA: Diagnosis not present

## 2020-11-04 DIAGNOSIS — F411 Generalized anxiety disorder: Secondary | ICD-10-CM | POA: Diagnosis not present

## 2020-11-04 DIAGNOSIS — R531 Weakness: Secondary | ICD-10-CM | POA: Diagnosis not present

## 2020-11-04 DIAGNOSIS — M069 Rheumatoid arthritis, unspecified: Secondary | ICD-10-CM | POA: Diagnosis not present

## 2020-11-04 DIAGNOSIS — R52 Pain, unspecified: Secondary | ICD-10-CM | POA: Diagnosis not present

## 2020-11-04 DIAGNOSIS — K219 Gastro-esophageal reflux disease without esophagitis: Secondary | ICD-10-CM | POA: Diagnosis not present

## 2020-11-06 DIAGNOSIS — E43 Unspecified severe protein-calorie malnutrition: Secondary | ICD-10-CM | POA: Diagnosis not present

## 2020-11-06 DIAGNOSIS — E559 Vitamin D deficiency, unspecified: Secondary | ICD-10-CM | POA: Diagnosis not present

## 2020-11-06 DIAGNOSIS — F411 Generalized anxiety disorder: Secondary | ICD-10-CM | POA: Diagnosis not present

## 2020-11-06 DIAGNOSIS — K219 Gastro-esophageal reflux disease without esophagitis: Secondary | ICD-10-CM | POA: Diagnosis not present

## 2020-11-06 DIAGNOSIS — M810 Age-related osteoporosis without current pathological fracture: Secondary | ICD-10-CM | POA: Diagnosis not present

## 2020-11-06 DIAGNOSIS — E785 Hyperlipidemia, unspecified: Secondary | ICD-10-CM | POA: Diagnosis not present

## 2020-11-07 DIAGNOSIS — Z7401 Bed confinement status: Secondary | ICD-10-CM | POA: Diagnosis not present

## 2020-11-07 DIAGNOSIS — E43 Unspecified severe protein-calorie malnutrition: Secondary | ICD-10-CM | POA: Diagnosis not present

## 2020-11-07 DIAGNOSIS — I739 Peripheral vascular disease, unspecified: Secondary | ICD-10-CM | POA: Diagnosis not present

## 2020-11-07 DIAGNOSIS — Z299 Encounter for prophylactic measures, unspecified: Secondary | ICD-10-CM | POA: Diagnosis not present

## 2020-11-07 DIAGNOSIS — L4050 Arthropathic psoriasis, unspecified: Secondary | ICD-10-CM | POA: Diagnosis not present

## 2020-11-07 DIAGNOSIS — E559 Vitamin D deficiency, unspecified: Secondary | ICD-10-CM | POA: Diagnosis not present

## 2020-11-07 DIAGNOSIS — E785 Hyperlipidemia, unspecified: Secondary | ICD-10-CM | POA: Diagnosis not present

## 2020-11-07 DIAGNOSIS — M810 Age-related osteoporosis without current pathological fracture: Secondary | ICD-10-CM | POA: Diagnosis not present

## 2020-11-07 DIAGNOSIS — J449 Chronic obstructive pulmonary disease, unspecified: Secondary | ICD-10-CM | POA: Diagnosis not present

## 2020-11-07 DIAGNOSIS — E1165 Type 2 diabetes mellitus with hyperglycemia: Secondary | ICD-10-CM | POA: Diagnosis not present

## 2020-11-07 DIAGNOSIS — K219 Gastro-esophageal reflux disease without esophagitis: Secondary | ICD-10-CM | POA: Diagnosis not present

## 2020-11-07 DIAGNOSIS — F411 Generalized anxiety disorder: Secondary | ICD-10-CM | POA: Diagnosis not present

## 2020-11-07 DIAGNOSIS — R279 Unspecified lack of coordination: Secondary | ICD-10-CM | POA: Diagnosis not present

## 2020-11-08 DIAGNOSIS — E559 Vitamin D deficiency, unspecified: Secondary | ICD-10-CM | POA: Diagnosis not present

## 2020-11-08 DIAGNOSIS — M810 Age-related osteoporosis without current pathological fracture: Secondary | ICD-10-CM | POA: Diagnosis not present

## 2020-11-08 DIAGNOSIS — F411 Generalized anxiety disorder: Secondary | ICD-10-CM | POA: Diagnosis not present

## 2020-11-08 DIAGNOSIS — K219 Gastro-esophageal reflux disease without esophagitis: Secondary | ICD-10-CM | POA: Diagnosis not present

## 2020-11-08 DIAGNOSIS — E43 Unspecified severe protein-calorie malnutrition: Secondary | ICD-10-CM | POA: Diagnosis not present

## 2020-11-08 DIAGNOSIS — E785 Hyperlipidemia, unspecified: Secondary | ICD-10-CM | POA: Diagnosis not present

## 2020-11-09 DIAGNOSIS — M810 Age-related osteoporosis without current pathological fracture: Secondary | ICD-10-CM | POA: Diagnosis not present

## 2020-11-09 DIAGNOSIS — E785 Hyperlipidemia, unspecified: Secondary | ICD-10-CM | POA: Diagnosis not present

## 2020-11-09 DIAGNOSIS — K219 Gastro-esophageal reflux disease without esophagitis: Secondary | ICD-10-CM | POA: Diagnosis not present

## 2020-11-09 DIAGNOSIS — E559 Vitamin D deficiency, unspecified: Secondary | ICD-10-CM | POA: Diagnosis not present

## 2020-11-09 DIAGNOSIS — F411 Generalized anxiety disorder: Secondary | ICD-10-CM | POA: Diagnosis not present

## 2020-11-09 DIAGNOSIS — E43 Unspecified severe protein-calorie malnutrition: Secondary | ICD-10-CM | POA: Diagnosis not present

## 2020-11-10 DIAGNOSIS — F411 Generalized anxiety disorder: Secondary | ICD-10-CM | POA: Diagnosis not present

## 2020-11-10 DIAGNOSIS — E785 Hyperlipidemia, unspecified: Secondary | ICD-10-CM | POA: Diagnosis not present

## 2020-11-10 DIAGNOSIS — M810 Age-related osteoporosis without current pathological fracture: Secondary | ICD-10-CM | POA: Diagnosis not present

## 2020-11-10 DIAGNOSIS — E559 Vitamin D deficiency, unspecified: Secondary | ICD-10-CM | POA: Diagnosis not present

## 2020-11-10 DIAGNOSIS — E43 Unspecified severe protein-calorie malnutrition: Secondary | ICD-10-CM | POA: Diagnosis not present

## 2020-11-10 DIAGNOSIS — K219 Gastro-esophageal reflux disease without esophagitis: Secondary | ICD-10-CM | POA: Diagnosis not present

## 2020-11-11 DIAGNOSIS — E559 Vitamin D deficiency, unspecified: Secondary | ICD-10-CM | POA: Diagnosis not present

## 2020-11-11 DIAGNOSIS — M810 Age-related osteoporosis without current pathological fracture: Secondary | ICD-10-CM | POA: Diagnosis not present

## 2020-11-11 DIAGNOSIS — K219 Gastro-esophageal reflux disease without esophagitis: Secondary | ICD-10-CM | POA: Diagnosis not present

## 2020-11-11 DIAGNOSIS — E785 Hyperlipidemia, unspecified: Secondary | ICD-10-CM | POA: Diagnosis not present

## 2020-11-11 DIAGNOSIS — E43 Unspecified severe protein-calorie malnutrition: Secondary | ICD-10-CM | POA: Diagnosis not present

## 2020-11-11 DIAGNOSIS — F411 Generalized anxiety disorder: Secondary | ICD-10-CM | POA: Diagnosis not present

## 2020-11-12 DIAGNOSIS — E559 Vitamin D deficiency, unspecified: Secondary | ICD-10-CM | POA: Diagnosis not present

## 2020-11-12 DIAGNOSIS — E785 Hyperlipidemia, unspecified: Secondary | ICD-10-CM | POA: Diagnosis not present

## 2020-11-12 DIAGNOSIS — F411 Generalized anxiety disorder: Secondary | ICD-10-CM | POA: Diagnosis not present

## 2020-11-12 DIAGNOSIS — K219 Gastro-esophageal reflux disease without esophagitis: Secondary | ICD-10-CM | POA: Diagnosis not present

## 2020-11-12 DIAGNOSIS — M810 Age-related osteoporosis without current pathological fracture: Secondary | ICD-10-CM | POA: Diagnosis not present

## 2020-11-12 DIAGNOSIS — E43 Unspecified severe protein-calorie malnutrition: Secondary | ICD-10-CM | POA: Diagnosis not present

## 2020-11-13 DIAGNOSIS — E43 Unspecified severe protein-calorie malnutrition: Secondary | ICD-10-CM | POA: Diagnosis not present

## 2020-11-13 DIAGNOSIS — E785 Hyperlipidemia, unspecified: Secondary | ICD-10-CM | POA: Diagnosis not present

## 2020-11-13 DIAGNOSIS — K219 Gastro-esophageal reflux disease without esophagitis: Secondary | ICD-10-CM | POA: Diagnosis not present

## 2020-11-13 DIAGNOSIS — M810 Age-related osteoporosis without current pathological fracture: Secondary | ICD-10-CM | POA: Diagnosis not present

## 2020-11-13 DIAGNOSIS — E559 Vitamin D deficiency, unspecified: Secondary | ICD-10-CM | POA: Diagnosis not present

## 2020-11-13 DIAGNOSIS — F411 Generalized anxiety disorder: Secondary | ICD-10-CM | POA: Diagnosis not present

## 2020-11-15 DIAGNOSIS — M810 Age-related osteoporosis without current pathological fracture: Secondary | ICD-10-CM | POA: Diagnosis not present

## 2020-11-15 DIAGNOSIS — E43 Unspecified severe protein-calorie malnutrition: Secondary | ICD-10-CM | POA: Diagnosis not present

## 2020-11-15 DIAGNOSIS — E559 Vitamin D deficiency, unspecified: Secondary | ICD-10-CM | POA: Diagnosis not present

## 2020-11-15 DIAGNOSIS — K219 Gastro-esophageal reflux disease without esophagitis: Secondary | ICD-10-CM | POA: Diagnosis not present

## 2020-11-15 DIAGNOSIS — E785 Hyperlipidemia, unspecified: Secondary | ICD-10-CM | POA: Diagnosis not present

## 2020-11-15 DIAGNOSIS — F411 Generalized anxiety disorder: Secondary | ICD-10-CM | POA: Diagnosis not present

## 2020-11-16 DIAGNOSIS — Z4800 Encounter for change or removal of nonsurgical wound dressing: Secondary | ICD-10-CM | POA: Diagnosis not present

## 2020-11-19 DEATH — deceased
